# Patient Record
Sex: Female | Born: 1990 | Race: White | Hispanic: No | Marital: Single | Smoking: Never smoker
Health system: Southern US, Community
[De-identification: ages and names within clinical notes are randomized; demographics above are authoritative.]

## PROBLEM LIST (undated history)

## (undated) ENCOUNTER — Inpatient Hospital Stay (HOSPITAL_COMMUNITY): Payer: Self-pay

## (undated) DIAGNOSIS — E079 Disorder of thyroid, unspecified: Secondary | ICD-10-CM

## (undated) DIAGNOSIS — N946 Dysmenorrhea, unspecified: Secondary | ICD-10-CM

## (undated) DIAGNOSIS — F419 Anxiety disorder, unspecified: Secondary | ICD-10-CM

## (undated) DIAGNOSIS — F988 Other specified behavioral and emotional disorders with onset usually occurring in childhood and adolescence: Secondary | ICD-10-CM

## (undated) HISTORY — DX: Other specified behavioral and emotional disorders with onset usually occurring in childhood and adolescence: F98.8

## (undated) HISTORY — DX: Disorder of thyroid, unspecified: E07.9

## (undated) HISTORY — PX: OTHER SURGICAL HISTORY: SHX169

## (undated) HISTORY — DX: Anxiety disorder, unspecified: F41.9

## (undated) HISTORY — DX: Dysmenorrhea, unspecified: N94.6

---

## 2010-06-24 LAB — HM PAP SMEAR: HM Pap smear: NORMAL

## 2010-08-12 ENCOUNTER — Emergency Department (HOSPITAL_BASED_OUTPATIENT_CLINIC_OR_DEPARTMENT_OTHER): Admission: EM | Admit: 2010-08-12 | Discharge: 2010-08-12 | Payer: Self-pay | Admitting: Emergency Medicine

## 2010-08-12 ENCOUNTER — Ambulatory Visit: Payer: Self-pay | Admitting: Diagnostic Radiology

## 2011-01-14 ENCOUNTER — Encounter: Payer: Self-pay | Admitting: *Deleted

## 2011-01-14 DIAGNOSIS — F32A Depression, unspecified: Secondary | ICD-10-CM | POA: Insufficient documentation

## 2011-01-14 DIAGNOSIS — F329 Major depressive disorder, single episode, unspecified: Secondary | ICD-10-CM

## 2013-06-20 ENCOUNTER — Telehealth: Payer: Self-pay | Admitting: Nurse Practitioner

## 2013-06-26 ENCOUNTER — Telehealth: Payer: Self-pay | Admitting: Nurse Practitioner

## 2013-06-26 NOTE — Telephone Encounter (Signed)
LM that she must be seen

## 2013-07-27 ENCOUNTER — Ambulatory Visit (INDEPENDENT_AMBULATORY_CARE_PROVIDER_SITE_OTHER): Payer: BC Managed Care – PPO | Admitting: Nurse Practitioner

## 2013-07-27 ENCOUNTER — Encounter: Payer: Self-pay | Admitting: Nurse Practitioner

## 2013-07-27 VITALS — BP 109/67 | HR 66 | Temp 99.0°F | Ht 62.0 in | Wt 177.0 lb

## 2013-07-27 DIAGNOSIS — F988 Other specified behavioral and emotional disorders with onset usually occurring in childhood and adolescence: Secondary | ICD-10-CM | POA: Insufficient documentation

## 2013-07-27 MED ORDER — LISDEXAMFETAMINE DIMESYLATE 30 MG PO CAPS: 30.0000 mg | ORAL_CAPSULE | ORAL | Status: DC

## 2013-07-27 NOTE — Patient Instructions (Signed)
Attention Deficit Hyperactivity Disorder Attention deficit hyperactivity disorder (ADHD) is a problem with behavior issues based on the way the brain functions (neurobehavioral disorder). It is a common reason for behavior and academic problems in school. CAUSES  The cause of ADHD is unknown in most cases. It may run in families. It sometimes can be associated with learning disabilities and other behavioral problems. SYMPTOMS  There are 3 types of ADHD. The 3 types and some of the symptoms include:  Inattentive  Gets bored or distracted easily.  Loses or forgets things. Forgets to hand in homework.  Has trouble organizing or completing tasks.  Difficulty staying on task.  An inability to organize daily tasks and school work.  Leaving projects, chores, or homework unfinished.  Trouble paying attention or responding to details. Careless mistakes.  Difficulty following directions. Often seems like is not listening.  Dislikes activities that require sustained attention (like chores or homework).  Hyperactive-impulsive  Feels like it is impossible to sit still or stay in a seat. Fidgeting with hands and feet.  Trouble waiting turn.  Talking too much or out of turn. Interruptive.  Speaks or acts impulsively.  Aggressive, disruptive behavior.  Constantly busy or on the go, noisy.  Combined  Has symptoms of both of the above. Often children with ADHD feel discouraged about themselves and with school. They often perform well below their abilities in school. These symptoms can cause problems in home, school, and in relationships with peers. As children get older, the excess motor activities can calm down, but the problems with paying attention and staying organized persist. Most children do not outgrow ADHD but with good treatment can learn to cope with the symptoms. DIAGNOSIS  When ADHD is suspected, the diagnosis should be made by professionals trained in ADHD.  Diagnosis will  include:  Ruling out other reasons for the child's behavior.  The caregivers will check with the child's school and check their medical records.  They will talk to teachers and parents.  Behavior rating scales for the child will be filled out by those dealing with the child on a daily basis. A diagnosis is made only after all information has been considered. TREATMENT  Treatment usually includes behavioral treatment often along with medicines. It may include stimulant medicines. The stimulant medicines decrease impulsivity and hyperactivity and increase attention. Other medicines used include antidepressants and certain blood pressure medicines. Most experts agree that treatment for ADHD should address all aspects of the child's functioning. Treatment should not be limited to the use of medicines alone. Treatment should include structured classroom management. The parents must receive education to address rewarding good behavior, discipline, and limit-setting. Tutoring or behavioral therapy or both should be available for the child. If untreated, the disorder can have long-term serious effects into adolescence and adulthood. HOME CARE INSTRUCTIONS   Often with ADHD there is a lot of frustration among the family in dealing with the illness. There is often blame and anger that is not warranted. This is a life long illness. There is no way to prevent ADHD. In many cases, because the problem affects the family as a whole, the entire family may need help. A therapist can help the family find better ways to handle the disruptive behaviors and promote change. If the child is young, most of the therapist's work is with the parents. Parents will learn techniques for coping with and improving their child's behavior. Sometimes only the child with the ADHD needs counseling. Your caregivers can help   you make these decisions.  Children with ADHD may need help in organizing. Some helpful tips include:  Keep  routines the same every day from wake-up time to bedtime. Schedule everything. This includes homework and playtime. This should include outdoor and indoor recreation. Keep the schedule on the refrigerator or a bulletin board where it is frequently seen. Mark schedule changes as far in advance as possible.  Have a place for everything and keep everything in its place. This includes clothing, backpacks, and school supplies.  Encourage writing down assignments and bringing home needed books.  Offer your child a well-balanced diet. Breakfast is especially important for school performance. Children should avoid drinks with caffeine including:  Soft drinks.  Coffee.  Tea.  However, some older children (adolescents) may find these drinks helpful in improving their attention.  Children with ADHD need consistent rules that they can understand and follow. If rules are followed, give small rewards. Children with ADHD often receive, and expect, criticism. Look for good behavior and praise it. Set realistic goals. Give clear instructions. Look for activities that can foster success and self-esteem. Make time for pleasant activities with your child. Give lots of affection.  Parents are their children's greatest advocates. Learn as much as possible about ADHD. This helps you become a stronger and better advocate for your child. It also helps you educate your child's teachers and instructors if they feel inadequate in these areas. Parent support groups are often helpful. A national group with local chapters is called CHADD (Children and Adults with Attention Deficit Hyperactivity Disorder). PROGNOSIS  There is no cure for ADHD. Children with the disorder seldom outgrow it. Many find adaptive ways to accommodate the ADHD as they mature. SEEK MEDICAL CARE IF:  Your child has repeated muscle twitches, cough or speech outbursts.  Your child has sleep problems.  Your child has a marked loss of  appetite.  Your child develops depression.  Your child has new or worsening behavioral problems.  Your child develops dizziness.  Your child has a racing heart.  Your child has stomach pains.  Your child develops headaches. Document Released: 10/01/2002 Document Revised: 01/03/2012 Document Reviewed: 05/13/2008 ExitCare Patient Information 2014 [employer].  

## 2013-07-27 NOTE — Progress Notes (Signed)
  Subjective:    Patient ID: Shelley Whitehead, female    DOB: 11/09/1990, 22 y.o.   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    Review of Systems  All other systems reviewed and are negative.       Objective:   Physical Exam  Constitutional: She appears well-developed and well-nourished.  Cardiovascular: Normal rate, regular rhythm and normal heart sounds.   Pulmonary/Chest: Effort normal and breath sounds normal.  Psychiatric: She has a normal mood and affect. Her behavior is normal. Judgment and thought content normal.   BP 109/67  Pulse 66  Temp(Src) 99 F (37.2 C) (Oral)  Ht 5\' 2"  (1.575 m)  Wt 177 lb (80.287 kg)  BMI 32.37 kg/m2  LMP 07/21/2013        Assessment & Plan:   1. ADD (attention deficit disorder)    Meds ordered this encounter  Medications  . DISCONTD: lisdexamfetamine (VYVANSE) 20 MG capsule    Sig: Take 20 mg by mouth every morning.  . lisdexamfetamine (VYVANSE) 30 MG capsule    Sig: Take 1 capsule (30 mg total) by mouth every morning.    Dispense:  30 capsule    Refill:  0    Order Specific Question:  Supervising Provider    Answer:  Ernestina Penna [[id number]]  . lisdexamfetamine (VYVANSE) 30 MG capsule    Sig: Take 1 capsule (30 mg total) by mouth every morning.    Dispense:  30 capsule    Refill:  0    Do NOT FILL TILL 08/26/13    Order Specific Question:  Supervising Provider    Answer:  Ernestina Penna [[id number]]  Stress management Follow up in 2 months  Mary-Margaret Daphine Deutscher, FNP

## 2013-07-30 ENCOUNTER — Telehealth: Payer: Self-pay | Admitting: Nurse Practitioner

## 2013-11-07 ENCOUNTER — Telehealth: Payer: Self-pay | Admitting: Nurse Practitioner

## 2013-11-07 MED ORDER — LISDEXAMFETAMINE DIMESYLATE 30 MG PO CAPS: 30.0000 mg | ORAL_CAPSULE | ORAL | Status: DC

## 2013-11-07 NOTE — Telephone Encounter (Signed)
rx ready for pickup 

## 2013-11-07 NOTE — Telephone Encounter (Signed)
Pt notified to pick up rx.

## 2014-01-14 ENCOUNTER — Telehealth: Payer: Self-pay | Admitting: Nurse Practitioner

## 2014-01-15 ENCOUNTER — Telehealth: Payer: Self-pay | Admitting: Nurse Practitioner

## 2014-01-15 NOTE — Telephone Encounter (Signed)
Left details, must pick up vyvance scripts.

## 2014-01-15 NOTE — Telephone Encounter (Signed)
This prescription has to be picked up. It can't be called in to the pharmacy.

## 2014-01-15 NOTE — Telephone Encounter (Signed)
Shouldn't need referral rto see family doctor

## 2014-01-16 NOTE — Telephone Encounter (Signed)
Pt aware.

## 2014-03-01 ENCOUNTER — Telehealth: Payer: Self-pay | Admitting: Nurse Practitioner

## 2014-03-01 MED ORDER — LISDEXAMFETAMINE DIMESYLATE 30 MG PO CAPS: 30.0000 mg | ORAL_CAPSULE | ORAL | Status: DC

## 2014-03-01 NOTE — Telephone Encounter (Signed)
Patient rx up front

## 2014-03-01 NOTE — Telephone Encounter (Signed)
NTBS for future refills 

## 2014-05-22 ENCOUNTER — Telehealth: Payer: Self-pay | Admitting: Family Medicine

## 2014-05-22 MED ORDER — LISDEXAMFETAMINE DIMESYLATE 30 MG PO CAPS: 30.0000 mg | ORAL_CAPSULE | ORAL | Status: DC

## 2014-05-22 NOTE — Telephone Encounter (Signed)
PATIENT AWARE

## 2014-05-22 NOTE — Telephone Encounter (Signed)
Not seen since 07/2013

## 2014-05-22 NOTE — Telephone Encounter (Signed)
rx ready for pickup 

## 2014-07-17 ENCOUNTER — Telehealth: Payer: Self-pay | Admitting: Nurse Practitioner

## 2014-07-17 MED ORDER — LISDEXAMFETAMINE DIMESYLATE 30 MG PO CAPS: 30.0000 mg | ORAL_CAPSULE | ORAL | Status: DC

## 2014-07-17 NOTE — Telephone Encounter (Signed)
rx ready for pickup 

## 2014-07-18 NOTE — Telephone Encounter (Signed)
Patient aware to pick up on vm

## 2014-09-02 ENCOUNTER — Telehealth: Payer: Self-pay | Admitting: Nurse Practitioner

## 2014-09-02 MED ORDER — LISDEXAMFETAMINE DIMESYLATE 30 MG PO CAPS: 30.0000 mg | ORAL_CAPSULE | ORAL | Status: DC

## 2014-09-02 NOTE — Telephone Encounter (Signed)
rx ready for pickup 

## 2014-09-03 NOTE — Telephone Encounter (Signed)
Patient aware to pick up left message

## 2014-10-28 ENCOUNTER — Telehealth: Payer: Self-pay | Admitting: *Deleted

## 2014-10-28 ENCOUNTER — Telehealth: Payer: Self-pay | Admitting: Nurse Practitioner

## 2014-10-28 MED ORDER — LISDEXAMFETAMINE DIMESYLATE 30 MG PO CAPS: 30.0000 mg | ORAL_CAPSULE | ORAL | Status: DC

## 2014-10-28 NOTE — Telephone Encounter (Signed)
vyvance script ready.

## 2014-10-28 NOTE — Telephone Encounter (Signed)
vyvanse rx ready for pick up  

## 2014-10-31 ENCOUNTER — Encounter: Payer: Self-pay | Admitting: Family Medicine

## 2014-10-31 ENCOUNTER — Other Ambulatory Visit: Payer: Self-pay | Admitting: Nurse Practitioner

## 2014-10-31 ENCOUNTER — Ambulatory Visit (INDEPENDENT_AMBULATORY_CARE_PROVIDER_SITE_OTHER): Payer: BC Managed Care – PPO | Admitting: Family Medicine

## 2014-10-31 VITALS — BP 120/76 | HR 73 | Temp 99.0°F | Ht 62.0 in | Wt 177.2 lb

## 2014-10-31 DIAGNOSIS — J069 Acute upper respiratory infection, unspecified: Secondary | ICD-10-CM

## 2014-10-31 MED ORDER — AMOXICILLIN 875 MG PO TABS: 875.0000 mg | ORAL_TABLET | Freq: Two times a day (BID) | ORAL | Status: DC

## 2014-10-31 NOTE — Progress Notes (Signed)
   Subjective:    Patient ID: Shelley Whitehead, female    DOB: 1991/05/30, 24 y.o.   MRN: [id number]  HPI Patient is here for c/o uri sx's for over a week.  Review of Systems  Constitutional: Negative for fever.  HENT: Negative for ear pain.   Eyes: Negative for discharge.  Respiratory: Negative for cough.   Cardiovascular: Negative for chest pain.  Gastrointestinal: Negative for abdominal distention.  Endocrine: Negative for polyuria.  Genitourinary: Negative for difficulty urinating.  Musculoskeletal: Negative for gait problem and neck pain.  Skin: Negative for color change and rash.  Neurological: Negative for speech difficulty and headaches.  Psychiatric/Behavioral: Negative for agitation.       Objective:    BP 120/76 mmHg  Pulse 73  Temp(Src) 99 F (37.2 C) (Oral)  Ht 5\' 2"  (1.575 m)  Wt 177 lb 3.2 oz (80.377 kg)  BMI 32.40 kg/m2  LMP 08/31/2014 Physical Exam  Constitutional: She is oriented to person, place, and time. She appears well-developed and well-nourished.  HENT:  Head: Normocephalic and atraumatic.  Mouth/Throat: Oropharynx is clear and moist.  Eyes: Pupils are equal, round, and reactive to light.  Neck: Normal range of motion. Neck supple.  Cardiovascular: Normal rate and regular rhythm.   No murmur heard. Pulmonary/Chest: Effort normal and breath sounds normal.  Abdominal: Soft. Bowel sounds are normal. There is no tenderness.  Neurological: She is alert and oriented to person, place, and time.  Skin: Skin is warm and dry.  Psychiatric: She has a normal mood and affect.          Assessment & Plan:     ICD-9-CM ICD-10-CM   1. URI (upper respiratory infection) 465.9 J06.9 amoxicillin (AMOXIL) 875 MG tablet   Push po fluids, rest, tylenol and motrin otc prn as directed for fever, arthralgias, and myalgias.  Follow up prn if sx's continue or persist.  No Follow-up on file.  Deatra CanterWilliam J Alaisha Eversley FNP

## 2014-12-06 ENCOUNTER — Telehealth: Payer: Self-pay | Admitting: Nurse Practitioner

## 2014-12-06 MED ORDER — LISDEXAMFETAMINE DIMESYLATE 30 MG PO CAPS: 30.0000 mg | ORAL_CAPSULE | ORAL | Status: DC

## 2014-12-06 NOTE — Telephone Encounter (Signed)
Patient aware rx ready to be picked up 

## 2014-12-06 NOTE — Telephone Encounter (Signed)
vyanse rx ready for pick up  

## 2014-12-10 ENCOUNTER — Telehealth: Payer: Self-pay | Admitting: *Deleted

## 2014-12-10 NOTE — Telephone Encounter (Signed)
Patient notified that rx up front and ready to pick up. 

## 2015-01-24 ENCOUNTER — Telehealth: Payer: Self-pay | Admitting: Nurse Practitioner

## 2015-01-24 MED ORDER — LISDEXAMFETAMINE DIMESYLATE 30 MG PO CAPS: 30.0000 mg | ORAL_CAPSULE | ORAL | Status: DC

## 2015-01-24 NOTE — Telephone Encounter (Signed)
vyvnase rx ready for pick up- no more refills without being seen  

## 2015-02-21 ENCOUNTER — Telehealth: Payer: Self-pay | Admitting: Nurse Practitioner

## 2015-02-21 MED ORDER — LISDEXAMFETAMINE DIMESYLATE 30 MG PO CAPS: 30.0000 mg | ORAL_CAPSULE | ORAL | Status: DC

## 2015-02-21 NOTE — Telephone Encounter (Signed)
RX ready for pick up 

## 2015-02-24 NOTE — Telephone Encounter (Signed)
Lm,script for vyvance ready. 

## 2015-04-23 ENCOUNTER — Telehealth: Payer: Self-pay | Admitting: Nurse Practitioner

## 2015-04-23 NOTE — Telephone Encounter (Signed)
Please review and advise.

## 2015-04-23 NOTE — Telephone Encounter (Signed)
Refill denied was told in april  no more refills without being seen

## 2015-04-23 NOTE — Telephone Encounter (Signed)
Pt.notified

## 2015-04-23 NOTE — Telephone Encounter (Signed)
Again refill deneied was told back in April that no more refills without being seen- was given another one last month but no more refills without being seen

## 2015-04-25 ENCOUNTER — Ambulatory Visit (INDEPENDENT_AMBULATORY_CARE_PROVIDER_SITE_OTHER): Payer: BC Managed Care – PPO | Admitting: Nurse Practitioner

## 2015-04-25 ENCOUNTER — Encounter: Payer: Self-pay | Admitting: Nurse Practitioner

## 2015-04-25 VITALS — BP 113/73 | HR 75 | Temp 98.6°F | Ht 62.0 in | Wt 191.0 lb

## 2015-04-25 DIAGNOSIS — F909 Attention-deficit hyperactivity disorder, unspecified type: Secondary | ICD-10-CM | POA: Diagnosis not present

## 2015-04-25 DIAGNOSIS — F988 Other specified behavioral and emotional disorders with onset usually occurring in childhood and adolescence: Secondary | ICD-10-CM

## 2015-04-25 MED ORDER — LISDEXAMFETAMINE DIMESYLATE 40 MG PO CAPS: 40.0000 mg | ORAL_CAPSULE | ORAL | Status: DC

## 2015-04-25 NOTE — Progress Notes (Signed)
   Subjective:    Patient ID: Shelley Whitehead, female    DOB: Jan 17, 1991, 24 y.o.   MRN: [id number]  HPI Patient brought in today by self for follow up of ADD. Currently taking vyvanse 30mg  daily. 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 Would like to try a higher dose.      Review of Systems  Constitutional: Negative.   HENT: Negative.   Respiratory: Negative.   Cardiovascular: Negative.   Gastrointestinal: Negative.   Genitourinary: Negative.   Neurological: Negative.   Psychiatric/Behavioral: Negative.   All other systems reviewed and are negative.      Objective:   Physical Exam  Constitutional: She is oriented to person, place, and time. She appears well-developed and well-nourished.  Cardiovascular: Normal rate, regular rhythm and normal heart sounds.   Pulmonary/Chest: Effort normal and breath sounds normal.  Neurological: She is alert and oriented to person, place, and time.  Skin: Skin is warm and dry.  Psychiatric: She has a normal mood and affect. Her behavior is normal. Judgment and thought content normal.   BP 113/73 mmHg  Pulse 75  Temp(Src) 98.6 F (37 C) (Oral)  Ht 5\' 2"  (1.575 m)  Wt 191 lb (86.637 kg)  BMI 34.93 kg/m2        Assessment & Plan:   1. ADD (attention deficit disorder)    Meds ordered this encounter  Medications  . levonorgestrel-ethinyl estradiol (AVIANE,ALESSE,LESSINA) 0.1-20 MG-MCG tablet    Sig: Take 1 tablet by mouth daily.  Marland Kitchen. lisdexamfetamine (VYVANSE) 40 MG capsule    Sig: Take 1 capsule (40 mg total) by mouth every morning.    Dispense:  30 capsule    Refill:  0    Order Specific Question:  Supervising Provider    Answer:  Ernestina PennaMOORE, DONALD W [[id number]]  . lisdexamfetamine (VYVANSE) 40 MG capsule    Sig: Take 1 capsule (40 mg total) by mouth every  morning.    Dispense:  30 capsule    Refill:  0    Do not fill till 05/25/15    Order Specific Question:  Supervising Provider    Answer:  Ernestina PennaMOORE, DONALD W [[id number]]  . lisdexamfetamine (VYVANSE) 40 MG capsule    Sig: Take 1 capsule (40 mg total) by mouth every morning.    Dispense:  30 capsule    Refill:  0    Do not fill till 06/24/15    Order Specific Question:  Supervising Provider    Answer:  Ernestina PennaMOORE, DONALD W [[id number]]   Stress management Follow up in 3 months  Mary-Margaret Daphine DeutscherMartin, FNP

## 2015-04-25 NOTE — Patient Instructions (Signed)
Stress and Stress Management Stress is a normal reaction to life events. It is what you feel when life demands more than you are used to or more than you can handle. Some stress can be useful. For example, the stress reaction can help you catch the last bus of the day, study for a test, or meet a deadline at work. But stress that occurs too often or for too long can cause problems. It can affect your emotional health and interfere with relationships and normal daily activities. Too much stress can weaken your immune system and increase your risk for physical illness. If you already have a medical problem, stress can make it worse. CAUSES  All sorts of life events may cause stress. An event that causes stress for one person may not be stressful for another person. Major life events commonly cause stress. These may be positive or negative. Examples include losing your job, moving into a new home, getting married, having a baby, or losing a loved one. Less obvious life events may also cause stress, especially if they occur day after day or in combination. Examples include working long hours, driving in traffic, caring for children, being in debt, or being in a difficult relationship. SIGNS AND SYMPTOMS Stress may cause emotional symptoms including, the following:  Anxiety. This is feeling worried, afraid, on edge, overwhelmed, or out of control.  Anger. This is feeling irritated or impatient.  Depression. This is feeling sad, down, helpless, or guilty.  Difficulty focusing, remembering, or making decisions. Stress may cause physical symptoms, including the following:   Aches and pains. These may affect your head, neck, back, stomach, or other areas of your body.  Tight muscles or clenched jaw.  Low energy or trouble sleeping. Stress may cause unhealthy behaviors, including the following:   Eating to feel better (overeating) or skipping meals.  Sleeping too little, too much, or both.  Working  too much or putting off tasks (procrastination).  Smoking, drinking alcohol, or using drugs to feel better. DIAGNOSIS  Stress is diagnosed through an assessment by your health care provider. Your health care provider will ask questions about your symptoms and any stressful life events.Your health care provider will also ask about your medical history and may order blood tests or other tests. Certain medical conditions and medicine can cause physical symptoms similar to stress. Mental illness can cause emotional symptoms and unhealthy behaviors similar to stress. Your health care provider may refer you to a mental health professional for further evaluation.  TREATMENT  Stress management is the recommended treatment for stress.The goals of stress management are reducing stressful life events and coping with stress in healthy ways.  Techniques for reducing stressful life events include the following:  Stress identification. Self-monitor for stress and identify what causes stress for you. These skills may help you to avoid some stressful events.  Time management. Set your priorities, keep a calendar of events, and learn to say "no." These tools can help you avoid making too many commitments. Techniques for coping with stress include the following:  Rethinking the problem. Try to think realistically about stressful events rather than ignoring them or overreacting. Try to find the positives in a stressful situation rather than focusing on the negatives.  Exercise. Physical exercise can release both physical and emotional tension. The key is to find a form of exercise you enjoy and do it regularly.  Relaxation techniques. These relax the body and mind. Examples include yoga, meditation, tai chi, biofeedback, deep  breathing, progressive muscle relaxation, listening to music, being out in nature, journaling, and other hobbies. Again, the key is to find one or more that you enjoy and can do  regularly.  Healthy lifestyle. Eat a balanced diet, get plenty of sleep, and do not smoke. Avoid using alcohol or drugs to relax.  Strong support network. Spend time with family, friends, or other people you enjoy being around.Express your feelings and talk things over with someone you trust. Counseling or talktherapy with a mental health professional may be helpful if you are having difficulty managing stress on your own. Medicine is typically not recommended for the treatment of stress.Talk to your health care provider if you think you need medicine for symptoms of stress. HOME CARE INSTRUCTIONS  Keep all follow-up visits as directed by your health care provider.  Take all medicines as directed by your health care provider. SEEK MEDICAL CARE IF:  Your symptoms get worse or you start having new symptoms.  You feel overwhelmed by your problems and can no longer manage them on your own. SEEK IMMEDIATE MEDICAL CARE IF:  You feel like hurting yourself or someone else. Document Released: 04/06/2001 Document Revised: 02/25/2014 Document Reviewed: 06/05/2013 ExitCare Patient Information 2015 [employer]. This information is not intended to replace advice given to you by your health care provider. Make sure you discuss any questions you have with your health care provider.  

## 2015-07-29 ENCOUNTER — Other Ambulatory Visit: Payer: Self-pay | Admitting: Advanced Practice Midwife

## 2015-08-21 ENCOUNTER — Telehealth: Payer: Self-pay | Admitting: Nurse Practitioner

## 2015-08-21 DIAGNOSIS — F988 Other specified behavioral and emotional disorders with onset usually occurring in childhood and adolescence: Secondary | ICD-10-CM

## 2015-08-21 MED ORDER — LISDEXAMFETAMINE DIMESYLATE 40 MG PO CAPS
40.0000 mg | ORAL_CAPSULE | ORAL | Status: DC
Start: 2015-08-21 — End: 2015-10-06

## 2015-08-21 NOTE — Telephone Encounter (Signed)
vyvanse rx ready for pick up  

## 2015-10-04 ENCOUNTER — Other Ambulatory Visit: Payer: Self-pay | Admitting: Nurse Practitioner

## 2015-10-04 DIAGNOSIS — F988 Other specified behavioral and emotional disorders with onset usually occurring in childhood and adolescence: Secondary | ICD-10-CM

## 2015-10-06 MED ORDER — LISDEXAMFETAMINE DIMESYLATE 40 MG PO CAPS: 40.0000 mg | ORAL_CAPSULE | ORAL | Status: DC

## 2015-10-06 NOTE — Telephone Encounter (Signed)
Pt notified that rx is ready to be picked up and up front and to bring ID

## 2015-10-06 NOTE — Telephone Encounter (Signed)
Last filled 08/21/15, last seen 04/25/15

## 2015-10-06 NOTE — Telephone Encounter (Signed)
vyvanse rx ready for pick up- Last refill without being seen   

## 2015-10-14 ENCOUNTER — Telehealth: Payer: Self-pay

## 2015-10-14 NOTE — Telephone Encounter (Signed)
Insurance prior authorized Vyvanse through 10/08/16

## 2015-11-17 ENCOUNTER — Other Ambulatory Visit: Payer: Self-pay | Admitting: Nurse Practitioner

## 2015-11-17 DIAGNOSIS — F988 Other specified behavioral and emotional disorders with onset usually occurring in childhood and adolescence: Secondary | ICD-10-CM

## 2015-11-17 NOTE — Telephone Encounter (Signed)
Refill deneid has not been seen in 6 months

## 2015-11-17 NOTE — Telephone Encounter (Signed)
Last filled 10/06/15, last seen 04/25/15

## 2015-11-19 ENCOUNTER — Telehealth: Payer: Self-pay | Admitting: Nurse Practitioner

## 2015-11-19 NOTE — Telephone Encounter (Signed)
Pt given appt with MMM tomorrow at 3:45.

## 2015-11-20 ENCOUNTER — Ambulatory Visit (INDEPENDENT_AMBULATORY_CARE_PROVIDER_SITE_OTHER): Payer: BC Managed Care – PPO | Admitting: Nurse Practitioner

## 2015-11-20 ENCOUNTER — Encounter: Payer: Self-pay | Admitting: Nurse Practitioner

## 2015-11-20 VITALS — BP 122/84 | HR 70 | Temp 97.8°F | Ht 62.0 in | Wt 193.0 lb

## 2015-11-20 DIAGNOSIS — F909 Attention-deficit hyperactivity disorder, unspecified type: Secondary | ICD-10-CM

## 2015-11-20 DIAGNOSIS — F988 Other specified behavioral and emotional disorders with onset usually occurring in childhood and adolescence: Secondary | ICD-10-CM

## 2015-11-20 MED ORDER — LISDEXAMFETAMINE DIMESYLATE 40 MG PO CAPS: 40.0000 mg | ORAL_CAPSULE | ORAL | Status: DC

## 2015-11-20 NOTE — Progress Notes (Signed)
   Subjective:    Patient ID: Shelley Whitehead, female    DOB: 1991-07-18, 25 y.o.   MRN: [id number]  HPI Patient in today for ADD follow up. 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 SHe has no side effects from medication.    Review of Systems  Constitutional: Negative.   HENT: Negative.   Respiratory: Negative.   Cardiovascular: Negative.   Genitourinary: Negative.   Neurological: Negative.   Psychiatric/Behavioral: Negative.   All other systems reviewed and are negative.      Objective:   Physical Exam  Constitutional: She is oriented to person, place, and time. She appears well-developed and well-nourished.  Cardiovascular: Normal rate and normal heart sounds.   Pulmonary/Chest: Effort normal and breath sounds normal.  Neurological: She is alert and oriented to person, place, and time.  Skin: Skin is warm.  Psychiatric: She has a normal mood and affect. Her behavior is normal. Judgment and thought content normal.    BP 122/84 mmHg  Pulse 70  Temp(Src) 97.8 F (36.6 C) (Oral)  Ht  (1.575 m)  Wt 193 lb (87.544 kg)  BMI 35.29 kg/m2       Assessment & Plan:  1. ADD (attention deficit disorder) Stress management Discussed new substance control policy - lisdexamfetamine (VYVANSE) 40 MG capsule; Take 1 capsule (40 mg total) by mouth every morning.  Dispense: 30 capsule; Refill: 0 - lisdexamfetamine (VYVANSE) 40 MG capsule; Take 1 capsule (40 mg total) by mouth every morning.  Dispense: 30 capsule; Refill: 0 - lisdexamfetamine (VYVANSE) 40 MG capsule; Take 1 capsule (40 mg total) by mouth every morning.  Dispense: 30 capsule; Refill: 0  Mary-Margaret Daphine Deutscher, FNP

## 2015-12-17 ENCOUNTER — Ambulatory Visit (INDEPENDENT_AMBULATORY_CARE_PROVIDER_SITE_OTHER): Payer: BC Managed Care – PPO | Admitting: Pediatrics

## 2015-12-17 ENCOUNTER — Encounter: Payer: Self-pay | Admitting: Pediatrics

## 2015-12-17 VITALS — BP 127/85 | HR 85 | Temp 97.6°F | Ht 62.0 in | Wt 188.6 lb

## 2015-12-17 DIAGNOSIS — J069 Acute upper respiratory infection, unspecified: Secondary | ICD-10-CM

## 2015-12-17 DIAGNOSIS — R6889 Other general symptoms and signs: Secondary | ICD-10-CM

## 2015-12-17 DIAGNOSIS — H6503 Acute serous otitis media, bilateral: Secondary | ICD-10-CM | POA: Diagnosis not present

## 2015-12-17 LAB — POCT INFLUENZA A/B
INFLUENZA A, POC: NEGATIVE
Influenza B, POC: NEGATIVE

## 2015-12-17 MED ORDER — AMOXICILLIN 500 MG PO CAPS: 500.0000 mg | ORAL_CAPSULE | Freq: Two times a day (BID) | ORAL | Status: DC

## 2015-12-17 NOTE — Progress Notes (Signed)
    Subjective:    Patient ID: Shelley Whitehead, female    DOB: October 29, 1990, 25 y.o.   MRN: [id number]  CC: Fever; Generalized Body Aches; and Nasal Congestion   HPI: Shelley Whitehead is a 25 y.o. 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 Interim medical history since our last visit reviewed. Allergies and medications reviewed and updated.    ROS: Per HPI unless specifically indicated above  History  Smoking status  . Never Smoker   Smokeless tobacco  . Not on file        Objective:    BP 127/85 mmHg  Pulse 85  Temp(Src) 97.6 F (36.4 C) (Oral)  Ht  (1.575 m)  Wt 188 lb 9.6 oz (85.548 kg)  BMI 34.49 kg/m2  Wt Readings from Last 3 Encounters:  12/17/15 188 lb 9.6 oz (85.548 kg)  11/20/15 193 lb (87.544 kg)  04/25/15 191 lb (86.637 kg)     Gen: NAD, alert, cooperative with exam, NCAT, congested EYES: EOMI, no scleral injection or icterus ENT:  TMs red and bulging b/l, OP with erythema, lesions LYMPH: +<1cm ant cervical LAD CV: NRRR, normal S1/S2, no murmur, distal pulses 2+ b/l Resp: CTABL, no wheezes, normal WOB Abd: +BS, soft, NTND.  Ext: No edema, warm Neuro: Alert and oriented, strength equal b/l UE and LE, coordination grossly normal MSK: normal muscle bulk     Assessment & Plan:    Shelley Whitehead was seen today for fever, generalized body aches and nasal congestion. Flu test negative. Discussed symptomatic care, may still have flu given symptoms and imperfect sensitivity of test, pt not within  window of treatment. Does have b/l AOM, pain in ears, fevers, will treat as below, also would cover for strep so will not test for strep at this time.  Diagnoses and all orders for this visit:  Flu-like symptoms -     POCT Influenza A/B  Bilateral acute serous otitis media, recurrence not specified -     amoxicillin (AMOXIL) 500 MG capsule; Take 1 capsule (500 mg total) by mouth 2 (two) times daily.  Follow up plan: Return if symptoms worsen or fail to improve.  Rex Kras, MD [facility] 12/17/2015, 5:51 PM

## 2015-12-23 ENCOUNTER — Telehealth: Payer: Self-pay | Admitting: Nurse Practitioner

## 2015-12-23 NOTE — Telephone Encounter (Signed)
She was seen last week for ear pain and it has gotten a lot worse, Sore throat, and neck pain

## 2015-12-23 NOTE — Telephone Encounter (Signed)
Advised pt of provider feedback. Pt voiced understanding.

## 2015-12-23 NOTE — Telephone Encounter (Signed)
Just viral- treat symptoms with motrin or tylenol- just has to run it course.

## 2016-02-03 ENCOUNTER — Encounter: Payer: Self-pay | Admitting: *Deleted

## 2016-02-03 ENCOUNTER — Ambulatory Visit (INDEPENDENT_AMBULATORY_CARE_PROVIDER_SITE_OTHER): Payer: BC Managed Care – PPO | Admitting: Nurse Practitioner

## 2016-02-03 ENCOUNTER — Encounter (INDEPENDENT_AMBULATORY_CARE_PROVIDER_SITE_OTHER): Payer: Self-pay

## 2016-02-03 ENCOUNTER — Encounter: Payer: Self-pay | Admitting: Nurse Practitioner

## 2016-02-03 VITALS — BP 117/72 | HR 70 | Temp 97.2°F | Ht 62.0 in | Wt 187.0 lb

## 2016-02-03 DIAGNOSIS — F909 Attention-deficit hyperactivity disorder, unspecified type: Secondary | ICD-10-CM | POA: Diagnosis not present

## 2016-02-03 DIAGNOSIS — F988 Other specified behavioral and emotional disorders with onset usually occurring in childhood and adolescence: Secondary | ICD-10-CM

## 2016-02-03 MED ORDER — LISDEXAMFETAMINE DIMESYLATE 40 MG PO CAPS: 40.0000 mg | ORAL_CAPSULE | ORAL | Status: DC

## 2016-02-03 NOTE — Progress Notes (Signed)
   Subjective:    Patient ID: Arman FilterMegan Spatafore, female    DOB: [birth year], 25 y.o.   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 She has had a long history of ADD. She has no medication side effects.    Review of Systems  Constitutional: Negative.   HENT: Negative.   Respiratory: Negative.   Cardiovascular: Negative.   Genitourinary: Negative.   Neurological: Negative.   Psychiatric/Behavioral: Negative.   All other systems reviewed and are negative.      Objective:   Physical Exam  Constitutional: She is oriented to person, place, and time. She appears well-developed and well-nourished.  Cardiovascular: Normal rate, regular rhythm and normal heart sounds.   Pulmonary/Chest: Effort normal and breath sounds normal.  Neurological: She is alert and oriented to person, place, and time.  Skin: Skin is warm.  Psychiatric: She has a normal mood and affect. Her behavior is normal. Judgment and thought content normal.   BP 117/72 mmHg  Pulse 70  Temp(Src) 97.2 F (36.2 C) (Oral)  Ht 5\' 2"  (1.575 m)  Wt 187 lb (84.823 kg)  BMI 34.19 kg/m2        Assessment & Plan:  1. ADD (attention deficit disorder) Stress mangement - lisdexamfetamine (VYVANSE) 40 MG capsule; Take 1 capsule (40 mg total) by mouth every morning.  Dispense: 30 capsule; Refill: 0 - lisdexamfetamine (VYVANSE) 40 MG capsule; Take 1 capsule (40 mg total) by mouth every morning.  Dispense: 30 capsule; Refill: 0 - lisdexamfetamine (VYVANSE) 40 MG capsule; Take 1 capsule (40 mg total) by mouth every morning.  Dispense: 30 capsule; Refill: 0  Mary-Margaret Daphine DeutscherMartin, FNP

## 2016-02-03 NOTE — Patient Instructions (Signed)
Stress and Stress Management Stress is a normal reaction to life events. It is what you feel when life demands more than you are used to or more than you can handle. Some stress can be useful. For example, the stress reaction can help you catch the last bus of the day, study for a test, or meet a deadline at work. But stress that occurs too often or for too long can cause problems. It can affect your emotional health and interfere with relationships and normal daily activities. Too much stress can weaken your immune system and increase your risk for physical illness. If you already have a medical problem, stress can make it worse. CAUSES  All sorts of life events may cause stress. An event that causes stress for one person may not be stressful for another person. Major life events commonly cause stress. These may be positive or negative. Examples include losing your job, moving into a new home, getting married, having a baby, or losing a loved one. Less obvious life events may also cause stress, especially if they occur day after day or in combination. Examples include working long hours, driving in traffic, caring for children, being in debt, or being in a difficult relationship. SIGNS AND SYMPTOMS Stress may cause emotional symptoms including, the following:  Anxiety. This is feeling worried, afraid, on edge, overwhelmed, or out of control.  Anger. This is feeling irritated or impatient.  Depression. This is feeling sad, down, helpless, or guilty.  Difficulty focusing, remembering, or making decisions. Stress may cause physical symptoms, including the following:   Aches and pains. These may affect your head, neck, back, stomach, or other areas of your body.  Tight muscles or clenched jaw.  Low energy or trouble sleeping. Stress may cause unhealthy behaviors, including the following:   Eating to feel better (overeating) or skipping meals.  Sleeping too little, too much, or both.  Working  too much or putting off tasks (procrastination).  Smoking, drinking alcohol, or using drugs to feel better. DIAGNOSIS  Stress is diagnosed through an assessment by your health care provider. Your health care provider will ask questions about your symptoms and any stressful life events.Your health care provider will also ask about your medical history and may order blood tests or other tests. Certain medical conditions and medicine can cause physical symptoms similar to stress. Mental illness can cause emotional symptoms and unhealthy behaviors similar to stress. Your health care provider may refer you to a mental health professional for further evaluation.  TREATMENT  Stress management is the recommended treatment for stress.The goals of stress management are reducing stressful life events and coping with stress in healthy ways.  Techniques for reducing stressful life events include the following:  Stress identification. Self-monitor for stress and identify what causes stress for you. These skills may help you to avoid some stressful events.  Time management. Set your priorities, keep a calendar of events, and learn to say "no." These tools can help you avoid making too many commitments. Techniques for coping with stress include the following:  Rethinking the problem. Try to think realistically about stressful events rather than ignoring them or overreacting. Try to find the positives in a stressful situation rather than focusing on the negatives.  Exercise. Physical exercise can release both physical and emotional tension. The key is to find a form of exercise you enjoy and do it regularly.  Relaxation techniques. These relax the body and mind. Examples include yoga, meditation, tai chi, biofeedback, deep  breathing, progressive muscle relaxation, listening to music, being out in nature, journaling, and other hobbies. Again, the key is to find one or more that you enjoy and can do  regularly.  Healthy lifestyle. Eat a balanced diet, get plenty of sleep, and do not smoke. Avoid using alcohol or drugs to relax.  Strong support network. Spend time with family, friends, or other people you enjoy being around.Express your feelings and talk things over with someone you trust. Counseling or talktherapy with a mental health professional may be helpful if you are having difficulty managing stress on your own. Medicine is typically not recommended for the treatment of stress.Talk to your health care provider if you think you need medicine for symptoms of stress. HOME CARE INSTRUCTIONS  Keep all follow-up visits as directed by your health care provider.  Take all medicines as directed by your health care provider. SEEK MEDICAL CARE IF:  Your symptoms get worse or you start having new symptoms.  You feel overwhelmed by your problems and can no longer manage them on your own. SEEK IMMEDIATE MEDICAL CARE IF:  You feel like hurting yourself or someone else.   This information is not intended to replace advice given to you by your health care provider. Make sure you discuss any questions you have with your health care provider.   Document Released: 04/06/2001 Document Revised: 11/01/2014 Document Reviewed: 06/05/2013 Elsevier Interactive Patient Education 2016 Elsevier Inc.  

## 2016-05-03 ENCOUNTER — Ambulatory Visit (INDEPENDENT_AMBULATORY_CARE_PROVIDER_SITE_OTHER): Payer: BC Managed Care – PPO | Admitting: Nurse Practitioner

## 2016-05-03 ENCOUNTER — Encounter: Payer: Self-pay | Admitting: Nurse Practitioner

## 2016-05-03 VITALS — BP 132/92 | HR 72 | Temp 97.6°F | Ht 62.0 in | Wt 192.0 lb

## 2016-05-03 DIAGNOSIS — Z0000 Encounter for general adult medical examination without abnormal findings: Secondary | ICD-10-CM | POA: Diagnosis not present

## 2016-05-03 DIAGNOSIS — J0100 Acute maxillary sinusitis, unspecified: Secondary | ICD-10-CM

## 2016-05-03 DIAGNOSIS — Z01419 Encounter for gynecological examination (general) (routine) without abnormal findings: Secondary | ICD-10-CM

## 2016-05-03 DIAGNOSIS — F988 Other specified behavioral and emotional disorders with onset usually occurring in childhood and adolescence: Secondary | ICD-10-CM

## 2016-05-03 MED ORDER — AZITHROMYCIN 250 MG PO TABS: ORAL_TABLET | ORAL | Status: DC

## 2016-05-03 MED ORDER — LISDEXAMFETAMINE DIMESYLATE 40 MG PO CAPS: 40.0000 mg | ORAL_CAPSULE | ORAL | Status: DC

## 2016-05-03 MED ORDER — LEVONORGESTREL-ETHINYL ESTRAD 0.1-20 MG-MCG PO TABS: 1.0000 | ORAL_TABLET | Freq: Every day | ORAL | Status: DC

## 2016-05-03 NOTE — Progress Notes (Signed)
Subjective:    Patient ID: Shelley Whitehead, female    DOB: February 22, 1991, 25 y.o.   MRN: [id number]  HPI Patient comes in today for CPE and pap exam- she is doing well today, but is complaining  Of cough and congestion that started 3 days ago- denies fever. She also needs refill on [facility] meds- she is on vyvanse 34m daily- helps her to stay calm and fcus- no sie effects.   Review of Systems  Constitutional: Negative for fever and chills.  HENT: Positive for congestion and sinus pressure. Negative for trouble swallowing.   Respiratory: Positive for cough (slight).   Cardiovascular: Negative.   Gastrointestinal: Negative.   Genitourinary: Negative.   Neurological: Negative.   Psychiatric/Behavioral: Negative.   All other systems reviewed and are negative.      Objective:   Physical Exam  Constitutional: She is oriented to person, place, and time. She appears well-developed and well-nourished.  HENT:  Head: Normocephalic.  Right Ear: Hearing, tympanic membrane, external ear and ear canal normal.  Left Ear: Hearing, tympanic membrane, external ear and ear canal normal.  Nose: Mucosal edema and rhinorrhea present. Right sinus exhibits maxillary sinus tenderness. Left sinus exhibits maxillary sinus tenderness.  Mouth/Throat: Uvula is midline and oropharynx is clear and moist.  Eyes: Conjunctivae and EOM are normal. Pupils are equal, round, and reactive to light.  Neck: Normal range of motion and full passive range of motion without pain. Neck supple. No JVD present. Carotid bruit is not present. No thyroid mass and no thyromegaly present.  Cardiovascular: Normal rate, normal heart sounds and intact distal pulses.   No murmur heard. Pulmonary/Chest: Effort normal and breath sounds normal. Right breast exhibits no inverted nipple, no mass, no nipple discharge, no skin change and no tenderness. Left breast exhibits no inverted nipple, no mass, no nipple discharge, no skin change and no  tenderness.  Abdominal: Soft. Bowel sounds are normal. She exhibits no mass. There is no tenderness.  Genitourinary: Vagina normal and uterus normal. No breast swelling, tenderness, discharge or bleeding.  bimanual exam-No adnexal masses or tenderness.   Musculoskeletal: Normal range of motion.  Lymphadenopathy:    She has no cervical adenopathy.  Neurological: She is alert and oriented to person, place, and time.  Skin: Skin is warm and dry.  Psychiatric: She has a normal mood and affect. Her behavior is normal. Judgment and thought content normal.    BP 132/92 mmHg  Pulse 72  Temp(Src) 97.6 F (36.4 C) (Oral)  Ht '5\' 2"'  (1.575 m)  Wt 192 lb (87.091 kg)  BMI 35.11 kg/m2       Assessment & Plan:  1. ADD (attention deficit disorder) Stress management - lisdexamfetamine (VYVANSE) 40 MG capsule; Take 1 capsule (40 mg total) by mouth every morning.  Dispense: 30 capsule; Refill: 0 - lisdexamfetamine (VYVANSE) 40 MG capsule; Take 1 capsule (40 mg total) by mouth every morning.  Dispense: 30 capsule; Refill: 0 - lisdexamfetamine (VYVANSE) 40 MG capsule; Take 1 capsule (40 mg total) by mouth every morning.  Dispense: 30 capsule; Refill: 0  2. Annual physical exam - CBC with Differential/Platelet - CMP14+EGFR - Lipid panel - Thyroid Panel With TSH  3. Encounter for routine gynecological examination - Pap IG, CT/NG w/ reflex HPV when ASC-U  4. Acute maxillary sinusitis, recurrence not specified 1. Take meds as prescribed 2. Use a cool mist humidifier especially during the winter months and when heat has been humid. 3. Use saline nose sprays frequently 4.  Saline irrigations of the nose can be very helpful if done frequently.  * 4X daily for 1 week*  * Use of a nettie pot can be helpful with this. Follow directions with this* 5. Drink plenty of fluids 6. Keep thermostat turn down low 7.For any cough or congestion  Use plain Mucinex- regular strength or max strength is fine   *  Children- consult with Pharmacist for dosing 8. For fever or aces or pains- take tylenol or ibuprofen appropriate for age and weight.  * for fevers greater than 101 orally you may alternate ibuprofen and tylenol every  3 hours.    - azithromycin (ZITHROMAX Z-PAK) 250 MG tablet; As directed  Dispense: 1 each; Refill: 0    Labs pending Health maintenance reviewed Diet and exercise encouraged Continue all meds Follow up  In 4 months   [place], FNP

## 2016-05-03 NOTE — Patient Instructions (Addendum)
Heart-Healthy Eating Plan Many factors influence your heart health, including eating and exercise habits. Heart (coronary) risk increases with abnormal blood fat (lipid) levels. Heart-healthy meal planning includes limiting unhealthy fats, increasing healthy fats, and making other small dietary changes. This includes maintaining a healthy body weight to help keep lipid levels within a normal range. WHAT IS MY PLAN?  Your health care provider recommends that you:  Get no more than ______30___% of the total calories in your daily diet from fat.  Limit your intake of saturated fat to less than ____10_____% of your total calories each day.  Limit the amount of cholesterol in your diet to less than __80_______ mg per day. WHAT TYPES OF FAT SHOULD I CHOOSE?  Choose healthy fats more often. Choose monounsaturated and polyunsaturated fats, such as olive oil and canola oil, flaxseeds, walnuts, almonds, and seeds.  Eat more omega-3 fats. Good choices include salmon, mackerel, sardines, tuna, flaxseed oil, and ground flaxseeds. Aim to eat fish at least two times each week.  Limit saturated fats. Saturated fats are primarily found in animal products, such as meats, butter, and cream. Plant sources of saturated fats include palm oil, palm kernel oil, and coconut oil.  Avoid foods with partially hydrogenated oils in them. These contain trans fats. Examples of foods that contain trans fats are stick margarine, some tub margarines, cookies, crackers, and other baked goods. WHAT GENERAL GUIDELINES DO I NEED TO FOLLOW?  Check food labels carefully to identify foods with trans fats or high amounts of saturated fat.  Fill one half of your plate with vegetables and green salads. Eat 4-5 servings of vegetables per day. A serving of vegetables equals 1 cup of raw leafy vegetables,  cup of raw or cooked cut-up vegetables, or  cup of vegetable juice.  Fill one fourth of your plate with whole grains. Look for the  word "whole" as the first word in the ingredient list.  Fill one fourth of your plate with lean protein foods.  Eat 4-5 servings of fruit per day. A serving of fruit equals one medium whole fruit,  cup of dried fruit,  cup of fresh, frozen, or canned fruit, or  cup of 100% fruit juice.  Eat more foods that contain soluble fiber. Examples of foods that contain this type of fiber are apples, broccoli, carrots, beans, peas, and barley. Aim to get 20-30 g of fiber per day.  Eat more home-cooked food and less restaurant, buffet, and fast food.  Limit or avoid alcohol.  Limit foods that are high in starch and sugar.  Avoid fried foods.  Cook foods by using methods other than frying. Baking, boiling, grilling, and broiling are all great options. Other fat-reducing suggestions include:  Removing the skin from poultry.  Removing all visible fats from meats.  Skimming the fat off of stews, soups, and gravies before serving them.  Steaming vegetables in water or broth.  Lose weight if you are overweight. Losing just 5-10% of your initial body weight can help your overall health and prevent diseases such as diabetes and heart disease.  Increase your consumption of nuts, legumes, and seeds to 4-5 servings per week. One serving of dried beans or legumes equals  cup after being cooked, one serving of nuts equals 1 ounces, and one serving of seeds equals  ounce or 1 tablespoon.  You may need to monitor your salt (sodium) intake, especially if you have high blood pressure. Talk with your health care provider or dietitian to get  more information about reducing sodium. WHAT FOODS CAN I EAT? Grains Breads, including [place], white, pita, wheat, raisin, rye, oatmeal, and [place]. Tortillas that are neither fried nor made with lard or trans fat. Low-fat rolls, including hotdog and hamburger buns and English muffins. Biscuits. Muffins. Waffles. Pancakes. Light popcorn. Whole-grain cereals. Flatbread.  Melba toast. Pretzels. Breadsticks. Rusks. Low-fat snacks and crackers, including oyster, saltine, matzo, graham, animal, and rye. Rice and pasta, including brown rice and those that are made with whole wheat. Vegetables All vegetables. Fruits All fruits, but limit coconut. Meats and Other Protein Sources Lean, well-trimmed beef, veal, pork, and lamb. Chicken and [place] without skin. All fish and shellfish. Wild duck, rabbit, pheasant, and venison. Egg whites or low-cholesterol egg substitutes. Dried beans, peas, lentils, and tofu.Seeds and most nuts. Dairy Low-fat or nonfat cheeses, including ricotta, string, and mozzarella. Skim or 1% milk that is liquid, powdered, or evaporated. Buttermilk that is made with low-fat milk. Nonfat or low-fat yogurt. Beverages Mineral water. Diet carbonated beverages. Sweets and Desserts Sherbets and fruit ices. Honey, jam, marmalade, jelly, and syrups. Meringues and gelatins. Pure sugar candy, such as hard candy, jelly beans, gumdrops, mints, marshmallows, and small amounts of dark chocolate. [employer] Eat all sweets and desserts in moderation. Fats and Oils Nonhydrogenated (trans-free) margarines. Vegetable oils, including soybean, sesame, sunflower, olive, peanut, safflower, corn, canola, and cottonseed. Salad dressings or mayonnaise that are made with a vegetable oil. Limit added fats and oils that you use for cooking, baking, salads, and as spreads. Other Cocoa powder. Coffee and tea. All seasonings and condiments. The items listed above may not be a complete list of recommended foods or beverages. Contact your dietitian for more options. WHAT FOODS ARE NOT RECOMMENDED? Grains Breads that are made with saturated or trans fats, oils, or whole milk. Croissants. Butter rolls. Cheese breads. Sweet rolls. Donuts. Buttered popcorn. Chow mein noodles. High-fat crackers, such as cheese or butter crackers. Meats and Other Protein Sources Fatty meats, such  as hotdogs, short ribs, sausage, spareribs, bacon, ribeye roast or steak, and mutton. High-fat deli meats, such as salami and bologna. Caviar. Domestic duck and goose. Organ meats, such as kidney, liver, sweetbreads, brains, gizzard, chitterlings, and heart. Dairy Cream, sour cream, cream cheese, and creamed cottage cheese. Whole milk cheeses, including blue (bleu), Monte[place] Jack, [place], [place], American, [place], Swiss, [place], [place], and [place]. Whole or 2% milk that is liquid, evaporated, or condensed. Whole buttermilk. Cream sauce or high-fat cheese sauce. Yogurt that is made from whole milk. Beverages Regular sodas and drinks with added sugar. Sweets and Desserts Frosting. Pudding. Cookies. Cakes other than angel food cake. Candy that has milk chocolate or white chocolate, hydrogenated fat, butter, coconut, or unknown ingredients. Buttered syrups. Full-fat ice cream or ice cream drinks. Fats and Oils Gravy that has suet, meat fat, or shortening. Cocoa butter, hydrogenated oils, palm oil, coconut oil, palm kernel oil. These can often be found in baked products, candy, fried foods, nondairy creamers, and whipped toppings. Solid fats and shortenings, including bacon fat, salt pork, lard, and butter. Nondairy cream substitutes, such as coffee creamers and sour cream substitutes. Salad dressings that are made of unknown oils, cheese, or sour cream. The items listed above may not be a complete list of foods and beverages to avoid. Contact your dietitian for more information.   This information is not intended to replace advice given to you by your health care provider. Make sure you discuss any questions you have with your health  care provider.   Document Released: 07/20/2008 Document Revised: 11/01/2014 Document Reviewed: 04/04/2014 Elsevier Interactive Patient Education [employer].

## 2016-05-04 LAB — PAP IG, CT-NG, RFX HPV ASCU
Chlamydia, Nuc. Acid Amp: NEGATIVE
Gonococcus by Nucleic Acid Amp: NEGATIVE
PAP Smear Comment: 0

## 2016-05-04 LAB — CMP14+EGFR
ALBUMIN: 3.9 g/dL (ref 3.5–5.5)
ALK PHOS: 65 IU/L (ref 39–117)
ALT: 17 IU/L (ref 0–32)
AST: 11 IU/L (ref 0–40)
Albumin/Globulin Ratio: 1.3 (ref 1.2–2.2)
BUN / CREAT RATIO: 26 — AB (ref 9–23)
BUN: 18 mg/dL (ref 6–20)
Bilirubin Total: 0.3 mg/dL (ref 0.0–1.2)
CALCIUM: 9.5 mg/dL (ref 8.7–10.2)
CO2: 22 mmol/L (ref 18–29)
CREATININE: 0.69 mg/dL (ref 0.57–1.00)
Chloride: 101 mmol/L (ref 96–106)
GFR calc Af Amer: 140 mL/min/{1.73_m2} (ref >59)
GFR, EST NON AFRICAN AMERICAN: 121 mL/min/{1.73_m2} (ref >59)
GLUCOSE: 99 mg/dL (ref 65–99)
Globulin, Total: 2.9 g/dL (ref 1.5–4.5)
Potassium: 4.8 mmol/L (ref 3.5–5.2)
Sodium: 139 mmol/L (ref 134–144)
Total Protein: 6.8 g/dL (ref 6.0–8.5)

## 2016-05-04 LAB — THYROID PANEL WITH TSH
Free Thyroxine Index: 2 (ref 1.2–4.9)
T3 Uptake Ratio: 20 % — ABNORMAL LOW (ref 24–39)
T4 TOTAL: 9.9 ug/dL (ref 4.5–12.0)
TSH: 3.87 u[IU]/mL (ref 0.450–4.500)

## 2016-05-04 LAB — CBC WITH DIFFERENTIAL/PLATELET
BASOS ABS: 0 10*3/uL (ref 0.0–0.2)
Basos: 0 %
EOS (ABSOLUTE): 0.1 10*3/uL (ref 0.0–0.4)
EOS: 2 %
HEMOGLOBIN: 12.9 g/dL (ref 11.1–15.9)
Hematocrit: 39.1 % (ref 34.0–46.6)
IMMATURE GRANS (ABS): 0 10*3/uL (ref 0.0–0.1)
IMMATURE GRANULOCYTES: 0 %
LYMPHS: 30 %
Lymphocytes Absolute: 1.7 10*3/uL (ref 0.7–3.1)
MCH: 29.3 pg (ref 26.6–33.0)
MCHC: 33 g/dL (ref 31.5–35.7)
MCV: 89 fL (ref 79–97)
MONOCYTES: 9 %
Monocytes Absolute: 0.5 10*3/uL (ref 0.1–0.9)
NEUTROS PCT: 59 %
Neutrophils Absolute: 3.3 10*3/uL (ref 1.4–7.0)
Platelets: 286 10*3/uL (ref 150–379)
RBC: 4.41 x10E6/uL (ref 3.77–5.28)
RDW: 13.7 % (ref 12.3–15.4)
WBC: 5.6 10*3/uL (ref 3.4–10.8)

## 2016-05-04 LAB — LIPID PANEL
CHOL/HDL RATIO: 3.2 ratio (ref 0.0–4.4)
CHOLESTEROL TOTAL: 165 mg/dL (ref 100–199)
HDL: 52 mg/dL (ref >39)
LDL CALC: 96 mg/dL (ref 0–99)
TRIGLYCERIDES: 85 mg/dL (ref 0–149)
VLDL CHOLESTEROL CAL: 17 mg/dL (ref 5–40)

## 2016-10-11 ENCOUNTER — Ambulatory Visit (INDEPENDENT_AMBULATORY_CARE_PROVIDER_SITE_OTHER): Payer: BC Managed Care – PPO | Admitting: Nurse Practitioner

## 2016-10-11 ENCOUNTER — Encounter: Payer: Self-pay | Admitting: Nurse Practitioner

## 2016-10-11 VITALS — BP 122/80 | HR 69 | Temp 98.4°F | Ht 62.0 in | Wt 176.0 lb

## 2016-10-11 DIAGNOSIS — F988 Other specified behavioral and emotional disorders with onset usually occurring in childhood and adolescence: Secondary | ICD-10-CM | POA: Diagnosis not present

## 2016-10-11 MED ORDER — LISDEXAMFETAMINE DIMESYLATE 40 MG PO CAPS: 40.0000 mg | ORAL_CAPSULE | ORAL | 0 refills | Status: DC

## 2016-10-11 NOTE — Progress Notes (Signed)
   Subjective:    Patient ID: Shelley FilterMegan Whitehead, female    DOB: 12/03/1990, 25 y.o.   MRN: [id number]  HPI Patient comes in today for follow up of adult residual ADHD- SHe is currently on vyvanse 40mg  daily- SHe has always had a problem concentrating and this meds helps her to stay focused and get her work done.  Review of Systems  Constitutional: Negative.   HENT: Negative.   Respiratory: Negative.   Cardiovascular: Negative.   Genitourinary: Negative.   Neurological: Negative.   Psychiatric/Behavioral: Negative.   All other systems reviewed and are negative.      Objective:   Physical Exam  Constitutional: She is oriented to person, place, and time. She appears well-developed and well-nourished. No distress.  Cardiovascular: Normal rate, regular rhythm and normal heart sounds.   Pulmonary/Chest: Effort normal and breath sounds normal.  Neurological: She is alert and oriented to person, place, and time.  Skin: Skin is warm.  Psychiatric: She has a normal mood and affect. Her behavior is normal. Judgment and thought content normal.    BP 122/80   Pulse 69   Temp 98.4 F (36.9 C) (Oral)   Ht 5\' 2"  (1.575 m)   Wt 176 lb (79.8 kg)   BMI 32.19 kg/m       Assessment & Plan:  1. Attention deficit disorder (ADD) without hyperactivity stress management - lisdexamfetamine (VYVANSE) 40 MG capsule; Take 1 capsule (40 mg total) by mouth every morning.  Dispense: 30 capsule; Refill: 0 - lisdexamfetamine (VYVANSE) 40 MG capsule; Take 1 capsule (40 mg total) by mouth every morning.  Dispense: 30 capsule; Refill: 0 - lisdexamfetamine (VYVANSE) 40 MG capsule; Take 1 capsule (40 mg total) by mouth every morning.  Dispense: 30 capsule; Refill: 0  Mary-Margaret Daphine DeutscherMartin, FNP

## 2016-11-16 ENCOUNTER — Ambulatory Visit: Payer: BC Managed Care – PPO | Admitting: Nurse Practitioner

## 2017-03-01 ENCOUNTER — Encounter: Payer: Self-pay | Admitting: Nurse Practitioner

## 2017-03-01 ENCOUNTER — Ambulatory Visit (INDEPENDENT_AMBULATORY_CARE_PROVIDER_SITE_OTHER): Payer: BC Managed Care – PPO | Admitting: Nurse Practitioner

## 2017-03-01 DIAGNOSIS — F988 Other specified behavioral and emotional disorders with onset usually occurring in childhood and adolescence: Secondary | ICD-10-CM | POA: Diagnosis not present

## 2017-03-01 MED ORDER — LISDEXAMFETAMINE DIMESYLATE 40 MG PO CAPS: 40.0000 mg | ORAL_CAPSULE | ORAL | 0 refills | Status: DC

## 2017-03-01 NOTE — Patient Instructions (Signed)
Stress and Stress Management Stress is a normal reaction to life events. It is what you feel when life demands more than you are used to or more than you can handle. Some stress can be useful. For example, the stress reaction can help you catch the last bus of the day, study for a test, or meet a deadline at work. But stress that occurs too often or for too long can cause problems. It can affect your emotional health and interfere with relationships and normal daily activities. Too much stress can weaken your immune system and increase your risk for physical illness. If you already have a medical problem, stress can make it worse. What are the causes? All sorts of life events may cause stress. An event that causes stress for one person may not be stressful for another person. Major life events commonly cause stress. These may be positive or negative. Examples include losing your job, moving into a new home, getting married, having a baby, or losing a loved one. Less obvious life events may also cause stress, especially if they occur day after day or in combination. Examples include working long hours, driving in traffic, caring for children, being in debt, or being in a difficult relationship. What are the signs or symptoms? Stress may cause emotional symptoms including, the following:  Anxiety. This is feeling worried, afraid, on edge, overwhelmed, or out of control.  Anger. This is feeling irritated or impatient.  Depression. This is feeling sad, down, helpless, or guilty.  Difficulty focusing, remembering, or making decisions. Stress may cause physical symptoms, including the following:  Aches and pains. These may affect your head, neck, back, stomach, or other areas of your body.  Tight muscles or clenched jaw.  Low energy or trouble sleeping. Stress may cause unhealthy behaviors, including the following:  Eating to feel better (overeating) or skipping meals.  Sleeping too little, too  much, or both.  Working too much or putting off tasks (procrastination).  Smoking, drinking alcohol, or using drugs to feel better. How is this diagnosed? Stress is diagnosed through an assessment by your health care provider. Your health care provider will ask questions about your symptoms and any stressful life events.Your health care provider will also ask about your medical history and may order blood tests or other tests. Certain medical conditions and medicine can cause physical symptoms similar to stress. Mental illness can cause emotional symptoms and unhealthy behaviors similar to stress. Your health care provider may refer you to a mental health professional for further evaluation. How is this treated? Stress management is the recommended treatment for stress.The goals of stress management are reducing stressful life events and coping with stress in healthy ways. Techniques for reducing stressful life events include the following:  Stress identification. Self-monitor for stress and identify what causes stress for you. These skills may help you to avoid some stressful events.  Time management. Set your priorities, keep a calendar of events, and learn to say "no." These tools can help you avoid making too many commitments. Techniques for coping with stress include the following:  Rethinking the problem. Try to think realistically about stressful events rather than ignoring them or overreacting. Try to find the positives in a stressful situation rather than focusing on the negatives.  Exercise. Physical exercise can release both physical and emotional tension. The key is to find a form of exercise you enjoy and do it regularly.  Relaxation techniques. These relax the body and mind. Examples include yoga,  meditation, tai chi, biofeedback, deep breathing, progressive muscle relaxation, listening to music, being out in nature, journaling, and other hobbies. Again, the key is to find one or  more that you enjoy and can do regularly.  Healthy lifestyle. Eat a balanced diet, get plenty of sleep, and do not smoke. Avoid using alcohol or drugs to relax.  Strong support network. Spend time with family, friends, or other people you enjoy being around.Express your feelings and talk things over with someone you trust. Counseling or talktherapy with a mental health professional may be helpful if you are having difficulty managing stress on your own. Medicine is typically not recommended for the treatment of stress.Talk to your health care provider if you think you need medicine for symptoms of stress. Follow these instructions at home:  Keep all follow-up visits as directed by your health care provider.  Take all medicines as directed by your health care provider. Contact a health care provider if:  Your symptoms get worse or you start having new symptoms.  You feel overwhelmed by your problems and can no longer manage them on your own. Get help right away if:  You feel like hurting yourself or someone else. This information is not intended to replace advice given to you by your health care provider. Make sure you discuss any questions you have with your health care provider. Document Released: 04/06/2001 Document Revised: 03/18/2016 Document Reviewed: 06/05/2013 Elsevier Interactive Patient Education  2017 [employer].

## 2017-03-01 NOTE — Progress Notes (Signed)
   Subjective:    Patient ID: Shelley Whitehead, female    DOB: [birth year], 26 y.o.   MRN: [id number]  HPI Patient come sin today for adult residual ADD. She is currently on vyvanse 40mg  daily and is doing well. Helps her to concentrate at work. SHe is having no side effects form medication. Sleeps good at night    Review of Systems  Constitutional: Negative.   Respiratory: Negative.   Cardiovascular: Negative.   Genitourinary: Negative.   Neurological: Negative.   Psychiatric/Behavioral: Negative.   All other systems reviewed and are negative.      Objective:   Physical Exam  Constitutional: She appears well-developed and well-nourished. No distress.  Cardiovascular: Normal rate and regular rhythm.   Pulmonary/Chest: Effort normal.  Neurological: She is alert.  Skin: Skin is warm.  Psychiatric: She has a normal mood and affect. Her behavior is normal. Judgment and thought content normal.    BP 110/65   Pulse 76   Temp 98.2 F (36.8 C) (Oral)   Ht 5\' 2"  (1.575 m)   Wt 194 lb (88 kg)   BMI 35.48 kg/m       Assessment & Plan:   1. Attention deficit disorder (ADD) without hyperactivity    Meds ordered this encounter  Medications  . lisdexamfetamine (VYVANSE) 40 MG capsule    Sig: Take 1 capsule (40 mg total) by mouth every morning.    Dispense:  30 capsule    Refill:  0    Order Specific Question:   Supervising Provider    Answer:   VINCENT, CAROL L [[id number]]  . lisdexamfetamine (VYVANSE) 40 MG capsule    Sig: Take 1 capsule (40 mg total) by mouth every morning.    Dispense:  30 capsule    Refill:  0    DO NOT FILL TILL 03/31/17    Order Specific Question:   Supervising Provider    Answer:   Rex KrasVINCENT, CAROL L [[id number]]  . lisdexamfetamine (VYVANSE) 40 MG capsule    Sig: Take 1 capsule (40 mg total) by mouth every morning.    Dispense:  30 capsule    Refill:  0    DO NOT FILL TILL 04/29/17    Order Specific Question:   Supervising Provider    Answer:   Johna SheriffVINCENT, CAROL L  [[id number]]   Stress management once again encouraged RTO prn  Mary-Margaret Daphine DeutscherMartin, FNP

## 2017-05-24 ENCOUNTER — Other Ambulatory Visit: Payer: Self-pay | Admitting: Nurse Practitioner

## 2017-06-21 ENCOUNTER — Other Ambulatory Visit: Payer: Self-pay | Admitting: Nurse Practitioner

## 2017-08-02 ENCOUNTER — Ambulatory Visit (INDEPENDENT_AMBULATORY_CARE_PROVIDER_SITE_OTHER): Payer: BC Managed Care – PPO | Admitting: Nurse Practitioner

## 2017-08-02 ENCOUNTER — Encounter: Payer: Self-pay | Admitting: Nurse Practitioner

## 2017-08-02 DIAGNOSIS — F988 Other specified behavioral and emotional disorders with onset usually occurring in childhood and adolescence: Secondary | ICD-10-CM | POA: Diagnosis not present

## 2017-08-02 MED ORDER — LISDEXAMFETAMINE DIMESYLATE 40 MG PO CAPS: 40.0000 mg | ORAL_CAPSULE | ORAL | 0 refills | Status: DC

## 2017-08-02 MED ORDER — LEVONORGESTREL-ETHINYL ESTRAD 0.1-20 MG-MCG PO TABS: 1.0000 | ORAL_TABLET | Freq: Every day | ORAL | 11 refills | Status: DC

## 2017-08-02 NOTE — Patient Instructions (Signed)
Stress and Stress Management Stress is a normal reaction to life events. It is what you feel when life demands more than you are used to or more than you can handle. Some stress can be useful. For example, the stress reaction can help you catch the last bus of the day, study for a test, or meet a deadline at work. But stress that occurs too often or for too long can cause problems. It can affect your emotional health and interfere with relationships and normal daily activities. Too much stress can weaken your immune system and increase your risk for physical illness. If you already have a medical problem, stress can make it worse. What are the causes? All sorts of life events may cause stress. An event that causes stress for one person may not be stressful for another person. Major life events commonly cause stress. These may be positive or negative. Examples include losing your job, moving into a new home, getting married, having a baby, or losing a loved one. Less obvious life events may also cause stress, especially if they occur day after day or in combination. Examples include working long hours, driving in traffic, caring for children, being in debt, or being in a difficult relationship. What are the signs or symptoms? Stress may cause emotional symptoms including, the following:  Anxiety. This is feeling worried, afraid, on edge, overwhelmed, or out of control.  Anger. This is feeling irritated or impatient.  Depression. This is feeling sad, down, helpless, or guilty.  Difficulty focusing, remembering, or making decisions.  Stress may cause physical symptoms, including the following:  Aches and pains. These may affect your head, neck, back, stomach, or other areas of your body.  Tight muscles or clenched jaw.  Low energy or trouble sleeping.  Stress may cause unhealthy behaviors, including the following:  Eating to feel better (overeating) or skipping meals.  Sleeping too little,  too much, or both.  Working too much or putting off tasks (procrastination).  Smoking, drinking alcohol, or using drugs to feel better.  How is this diagnosed? Stress is diagnosed through an assessment by your health care provider. Your health care provider will ask questions about your symptoms and any stressful life events.Your health care provider will also ask about your medical history and may order blood tests or other tests. Certain medical conditions and medicine can cause physical symptoms similar to stress. Mental illness can cause emotional symptoms and unhealthy behaviors similar to stress. Your health care provider may refer you to a mental health professional for further evaluation. How is this treated? Stress management is the recommended treatment for stress.The goals of stress management are reducing stressful life events and coping with stress in healthy ways. Techniques for reducing stressful life events include the following:  Stress identification. Self-monitor for stress and identify what causes stress for you. These skills may help you to avoid some stressful events.  Time management. Set your priorities, keep a calendar of events, and learn to say "no." These tools can help you avoid making too many commitments.  Techniques for coping with stress include the following:  Rethinking the problem. Try to think realistically about stressful events rather than ignoring them or overreacting. Try to find the positives in a stressful situation rather than focusing on the negatives.  Exercise. Physical exercise can release both physical and emotional tension. The key is to find a form of exercise you enjoy and do it regularly.  Relaxation techniques. These relax the body and  mind. Examples include yoga, meditation, tai chi, biofeedback, deep breathing, progressive muscle relaxation, listening to music, being out in nature, journaling, and other hobbies. Again, the key is to find  one or more that you enjoy and can do regularly.  Healthy lifestyle. Eat a balanced diet, get plenty of sleep, and do not smoke. Avoid using alcohol or drugs to relax.  Strong support network. Spend time with family, friends, or other people you enjoy being around.Express your feelings and talk things over with someone you trust.  Counseling or talktherapy with a mental health professional may be helpful if you are having difficulty managing stress on your own. Medicine is typically not recommended for the treatment of stress.Talk to your health care provider if you think you need medicine for symptoms of stress. Follow these instructions at home:  Keep all follow-up visits as directed by your health care provider.  Take all medicines as directed by your health care provider. Contact a health care provider if:  Your symptoms get worse or you start having new symptoms.  You feel overwhelmed by your problems and can no longer manage them on your own. Get help right away if:  You feel like hurting yourself or someone else. This information is not intended to replace advice given to you by your health care provider. Make sure you discuss any questions you have with your health care provider. Document Released: 04/06/2001 Document Revised: 03/18/2016 Document Reviewed: 06/05/2013 Elsevier Interactive Patient Education  2017 [employer]  

## 2017-08-02 NOTE — Progress Notes (Signed)
   Subjective:    Patient ID: Shelley Whitehead, female    DOB: [birth year], 26 y.o.   MRN: [id number]  HPI Patient in today for follow up of adult residual ADHD. 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 She Is doing wwell without medication side effects.    Review of Systems  Constitutional: Negative.   Respiratory: Negative.   Cardiovascular: Negative.   Gastrointestinal: Negative.   Neurological: Negative.   Psychiatric/Behavioral: Negative.   All other systems reviewed and are negative.      Objective:   Physical Exam  Constitutional: She is oriented to person, place, and time. She appears well-developed and well-nourished.  Cardiovascular: Normal rate and regular rhythm.   Pulmonary/Chest: Effort normal and breath sounds normal.  Neurological: She is alert and oriented to person, place, and time.  Skin: Skin is warm.  Psychiatric: She has a normal mood and affect. Her behavior is normal. Judgment and thought content normal.   BP 119/72   Pulse 82   Temp 99.2 F (37.3 C) (Oral)   Ht  (1.575 m)   Wt 208 lb 6.4 oz (94.5 kg)   BMI 38.12 kg/m        Assessment & Plan:  1. Attention deficit disorder (ADD) without hyperactivity Stress management - lisdexamfetamine (VYVANSE) 40 MG capsule; Take 1 capsule (40 mg total) by mouth every morning.  Dispense: 30 capsule; Refill: 0 - lisdexamfetamine (VYVANSE) 40 MG capsule; Take 1 capsule (40 mg total) by mouth every morning.  Dispense: 30 capsule; Refill: 0 - lisdexamfetamine (VYVANSE) 40 MG capsule; Take 1 capsule (40 mg total) by mouth every morning.  Dispense: 30 capsule; Refill: 0    Labs pending Health maintenance reviewed Diet and exercise encouraged Continue all meds Follow up  In 3 months   Mary-Margaret Daphine Deutscher, FNP

## 2017-09-20 ENCOUNTER — Ambulatory Visit: Payer: BC Managed Care – PPO | Admitting: Nurse Practitioner

## 2017-09-20 ENCOUNTER — Encounter: Payer: Self-pay | Admitting: Nurse Practitioner

## 2017-09-20 VITALS — BP 125/86 | HR 89 | Temp 98.0°F | Ht 62.0 in | Wt 192.0 lb

## 2017-09-20 DIAGNOSIS — N920 Excessive and frequent menstruation with regular cycle: Secondary | ICD-10-CM

## 2017-09-20 DIAGNOSIS — F332 Major depressive disorder, recurrent severe without psychotic features: Secondary | ICD-10-CM | POA: Diagnosis not present

## 2017-09-20 MED ORDER — NORETHIN ACE-ETH ESTRAD-FE 1.5-30 MG-MCG PO TABS: 1.0000 | ORAL_TABLET | Freq: Every day | ORAL | 11 refills | Status: DC

## 2017-09-20 MED ORDER — SERTRALINE HCL 100 MG PO TABS: 100.0000 mg | ORAL_TABLET | Freq: Every day | ORAL | 5 refills | Status: DC

## 2017-09-20 NOTE — Patient Instructions (Signed)
Stress and Stress Management Stress is a normal reaction to life events. It is what you feel when life demands more than you are used to or more than you can handle. Some stress can be useful. For example, the stress reaction can help you catch the last bus of the day, study for a test, or meet a deadline at work. But stress that occurs too often or for too long can cause problems. It can affect your emotional health and interfere with relationships and normal daily activities. Too much stress can weaken your immune system and increase your risk for physical illness. If you already have a medical problem, stress can make it worse. What are the causes? All sorts of life events may cause stress. An event that causes stress for one person may not be stressful for another person. Major life events commonly cause stress. These may be positive or negative. Examples include losing your job, moving into a new home, getting married, having a baby, or losing a loved one. Less obvious life events may also cause stress, especially if they occur day after day or in combination. Examples include working long hours, driving in traffic, caring for children, being in debt, or being in a difficult relationship. What are the signs or symptoms? Stress may cause emotional symptoms including, the following:  Anxiety. This is feeling worried, afraid, on edge, overwhelmed, or out of control.  Anger. This is feeling irritated or impatient.  Depression. This is feeling sad, down, helpless, or guilty.  Difficulty focusing, remembering, or making decisions.  Stress may cause physical symptoms, including the following:  Aches and pains. These may affect your head, neck, back, stomach, or other areas of your body.  Tight muscles or clenched jaw.  Low energy or trouble sleeping.  Stress may cause unhealthy behaviors, including the following:  Eating to feel better (overeating) or skipping meals.  Sleeping too little,  too much, or both.  Working too much or putting off tasks (procrastination).  Smoking, drinking alcohol, or using drugs to feel better.  How is this diagnosed? Stress is diagnosed through an assessment by your health care provider. Your health care provider will ask questions about your symptoms and any stressful life events.Your health care provider will also ask about your medical history and may order blood tests or other tests. Certain medical conditions and medicine can cause physical symptoms similar to stress. Mental illness can cause emotional symptoms and unhealthy behaviors similar to stress. Your health care provider may refer you to a mental health professional for further evaluation. How is this treated? Stress management is the recommended treatment for stress.The goals of stress management are reducing stressful life events and coping with stress in healthy ways. Techniques for reducing stressful life events include the following:  Stress identification. Self-monitor for stress and identify what causes stress for you. These skills may help you to avoid some stressful events.  Time management. Set your priorities, keep a calendar of events, and learn to say "no." These tools can help you avoid making too many commitments.  Techniques for coping with stress include the following:  Rethinking the problem. Try to think realistically about stressful events rather than ignoring them or overreacting. Try to find the positives in a stressful situation rather than focusing on the negatives.  Exercise. Physical exercise can release both physical and emotional tension. The key is to find a form of exercise you enjoy and do it regularly.  Relaxation techniques. These relax the body and  mind. Examples include yoga, meditation, tai chi, biofeedback, deep breathing, progressive muscle relaxation, listening to music, being out in nature, journaling, and other hobbies. Again, the key is to find  one or more that you enjoy and can do regularly.  Healthy lifestyle. Eat a balanced diet, get plenty of sleep, and do not smoke. Avoid using alcohol or drugs to relax.  Strong support network. Spend time with family, friends, or other people you enjoy being around.Express your feelings and talk things over with someone you trust.  Counseling or talktherapy with a mental health professional may be helpful if you are having difficulty managing stress on your own. Medicine is typically not recommended for the treatment of stress.Talk to your health care provider if you think you need medicine for symptoms of stress. Follow these instructions at home:  Keep all follow-up visits as directed by your health care provider.  Take all medicines as directed by your health care provider. Contact a health care provider if:  Your symptoms get worse or you start having new symptoms.  You feel overwhelmed by your problems and can no longer manage them on your own. Get help right away if:  You feel like hurting yourself or someone else. This information is not intended to replace advice given to you by your health care provider. Make sure you discuss any questions you have with your health care provider. Document Released: 04/06/2001 Document Revised: 03/18/2016 Document Reviewed: 06/05/2013 Elsevier Interactive Patient Education  2017 [employer]  

## 2017-09-20 NOTE — Progress Notes (Addendum)
   Subjective:    Patient ID: Shelley FilterMegan Penland, female    DOB: [birth year], 26 y.o.   MRN: [id number]  HPI  Patient comes in with 2 complaints: - excessive menustral bleeding the last 2 cycles-lasting longer then 10 days with very heavy bleeding. vVenva birth control for 2-3 years. She has been under a lot  stress at work and in personal life. - panic attacks- had bad situation at work yesterday and she just walked out at 11am yesterday. Said she flt like packing her stuff and just leaving town and starting her life over again. Then she started having suicidal thoughts. Says she is having suicidal thoughts now as well. But says that she could not do anything to herself. She has had physical symptoms with her anxiety, nausea, poor appetite. [facility] 09/20/2017 08/02/2017 03/01/2017 10/11/2016 05/03/2016  Decreased Interest 3 0 0 1 0  Down, Depressed, Hopeless 3 0 0 1 0  PHQ - 2 Score 6 0 0 2 0  Altered sleeping 3 - - 1 -  Tired, decreased energy 3 - - 1 -  Change in appetite 3 - - 1 -  Feeling bad or failure about yourself  3 - - 0 -  Trouble concentrating 1 - - 0 -  Moving slowly or fidgety/restless 0 - - 0 -  Suicidal thoughts 3 - - 0 -  PHQ-9 Score 22 - - 5 -     Review of Systems  Constitutional: Negative.   Respiratory: Negative.   Cardiovascular: Negative.   Genitourinary: Negative.   Neurological: Negative.   Psychiatric/Behavioral: Positive for sleep disturbance and suicidal ideas. The patient is nervous/anxious.   All other systems reviewed and are negative.      Objective:   Physical Exam  Constitutional: She is oriented to person, place, and time. She appears well-developed and well-nourished. She appears distressed.  Cardiovascular: Normal rate and regular rhythm.  Pulmonary/Chest: Effort normal and breath sounds normal.  Neurological: She is alert and oriented to person, place, and time.  Skin: Skin is warm.  Psychiatric: She has a normal mood and affect.  Her behavior is normal. Judgment and thought content normal.   BP 125/86   Pulse 89   Temp 98 F (36.7 C) (Oral)   Ht 5\' 2"  (1.575 m)   Wt 192 lb (87.1 kg)   BMI 35.12 kg/m       Assessment & Plan:   1. Severe episode of recurrent major depressive disorder, without psychotic features [facility](HCC) Stress management Called mom and told her not to leave her alone tonight- patient is to go directly to her mom house from here. Mom is to cll  Me if not showing up. - sertraline (ZOLOFT) 100 MG tablet; Take 1 tablet (100 mg total) by mouth daily.  Dispense: 30 tablet; Refill: 5  2. Menorrhagia with regular cycle Changed birth control pills Start new pack after next cycle - norethindrone-ethinyl estradiol-iron (MICROGESTIN FE,GILDESS FE,LOESTRIN FE) 1.5-30 MG-MCG tablet; Take 1 tablet by mouth daily.  Dispense: 1 Package; Refill: 11  Mary-Margaret Daphine DeutscherMartin, FNP

## 2017-09-27 ENCOUNTER — Other Ambulatory Visit: Payer: BC Managed Care – PPO | Admitting: Nurse Practitioner

## 2017-10-06 ENCOUNTER — Encounter: Payer: Self-pay | Admitting: Nurse Practitioner

## 2017-10-06 ENCOUNTER — Ambulatory Visit: Payer: BC Managed Care – PPO | Admitting: Nurse Practitioner

## 2017-10-06 VITALS — BP 115/78 | HR 78 | Temp 99.2°F | Ht 62.0 in | Wt 190.0 lb

## 2017-10-06 DIAGNOSIS — F332 Major depressive disorder, recurrent severe without psychotic features: Secondary | ICD-10-CM

## 2017-10-06 NOTE — Progress Notes (Signed)
   Subjective:    Patient ID: Shelley Whitehead, female    DOB: 1991/01/24, 26 y.o.   MRN: [id number]  HPI  Patient comes in today for follow up- she was seen 2 weeks ago and was very upset and having suicidal thoughts. Refused to go  To hospital, lso I contacted her mom and she said she would stay with hr until she was better. We started her back on zoloft 100 mg daily. She is some bettter today but has not been on meds yet to fully get effect. She denies any side effects. [facility] 10/06/2017 09/20/2017 08/02/2017  Decreased Interest 2 3 0  Down, Depressed, Hopeless 2 3 0  PHQ - 2 Score 4 6 0  Altered sleeping 2 3 -  Tired, decreased energy 2 3 -  Change in appetite 2 3 -  Feeling bad or failure about yourself  2 3 -  Trouble concentrating 1 1 -  Moving slowly or fidgety/restless 0 0 -  Suicidal thoughts 2 3 -  PHQ-9 Score 15 22 -     Review of Systems  Constitutional: Negative for activity change and appetite change.  HENT: Negative.   Eyes: Negative for pain.  Respiratory: Negative for shortness of breath.   Cardiovascular: Negative for chest pain, palpitations and leg swelling.  Gastrointestinal: Negative for abdominal pain.  Endocrine: Negative for polydipsia.  Genitourinary: Negative.   Skin: Negative for rash.  Neurological: Negative for dizziness, weakness and headaches.  Hematological: Does not bruise/bleed easily.  Psychiatric/Behavioral: Negative.   All other systems reviewed and are negative.      Objective:   Physical Exam  Constitutional: She is oriented to person, place, and time. She appears well-developed and well-nourished.  HENT:  Nose: Nose normal.  Mouth/Throat: Oropharynx is clear and moist.  Eyes: EOM are normal.  Neck: Trachea normal, normal range of motion and full passive range of motion without pain. Neck supple. No JVD present. Carotid bruit is not present. No thyromegaly present.  Cardiovascular: Normal rate, regular rhythm, normal  heart sounds and intact distal pulses. Exam reveals no gallop and no friction rub.  No murmur heard. Pulmonary/Chest: Effort normal and breath sounds normal.  Abdominal: Soft. Bowel sounds are normal. She exhibits no distension and no mass. There is no tenderness.  Musculoskeletal: Normal range of motion.  Lymphadenopathy:    She has no cervical adenopathy.  Neurological: She is alert and oriented to person, place, and time. She has normal reflexes.  Skin: Skin is warm and dry.  Psychiatric: She has a normal mood and affect. Her behavior is normal. Judgment and thought content normal.   BP 115/78   Pulse 78   Temp 99.2 F (37.3 C) (Oral)   Ht 5\' 2"  (1.575 m)   Wt 190 lb (86.2 kg)   BMI 34.75 kg/m       Assessment & Plan:   1. Severe episode of recurrent major depressive disorder, without psychotic features (HCC)    Doing much better continue stress management Continue zoloft as rx RTO prn  Mary-Margaret Daphine DeutscherMartin, FNP

## 2017-10-19 ENCOUNTER — Other Ambulatory Visit: Payer: Self-pay

## 2017-10-19 DIAGNOSIS — F332 Major depressive disorder, recurrent severe without psychotic features: Secondary | ICD-10-CM

## 2017-10-19 MED ORDER — SERTRALINE HCL 100 MG PO TABS: 100.0000 mg | ORAL_TABLET | Freq: Every day | ORAL | 0 refills | Status: DC

## 2017-12-20 ENCOUNTER — Ambulatory Visit: Payer: BC Managed Care – PPO | Admitting: Nurse Practitioner

## 2017-12-20 ENCOUNTER — Encounter: Payer: Self-pay | Admitting: Nurse Practitioner

## 2017-12-20 DIAGNOSIS — F988 Other specified behavioral and emotional disorders with onset usually occurring in childhood and adolescence: Secondary | ICD-10-CM

## 2017-12-20 MED ORDER — LISDEXAMFETAMINE DIMESYLATE 40 MG PO CAPS: 40.0000 mg | ORAL_CAPSULE | ORAL | 0 refills | Status: DC

## 2017-12-20 MED ORDER — LISDEXAMFETAMINE DIMESYLATE 40 MG PO CAPS
40.0000 mg | ORAL_CAPSULE | ORAL | 0 refills | Status: DC
Start: 2017-12-20 — End: 2018-05-19

## 2017-12-20 NOTE — Patient Instructions (Signed)
Stress and Stress Management Stress is a normal reaction to life events. It is what you feel when life demands more than you are used to or more than you can handle. Some stress can be useful. For example, the stress reaction can help you catch the last bus of the day, study for a test, or meet a deadline at work. But stress that occurs too often or for too long can cause problems. It can affect your emotional health and interfere with relationships and normal daily activities. Too much stress can weaken your immune system and increase your risk for physical illness. If you already have a medical problem, stress can make it worse. What are the causes? All sorts of life events may cause stress. An event that causes stress for one person may not be stressful for another person. Major life events commonly cause stress. These may be positive or negative. Examples include losing your job, moving into a new home, getting married, having a baby, or losing a loved one. Less obvious life events may also cause stress, especially if they occur day after day or in combination. Examples include working long hours, driving in traffic, caring for children, being in debt, or being in a difficult relationship. What are the signs or symptoms? Stress may cause emotional symptoms including, the following:  Anxiety. This is feeling worried, afraid, on edge, overwhelmed, or out of control.  Anger. This is feeling irritated or impatient.  Depression. This is feeling sad, down, helpless, or guilty.  Difficulty focusing, remembering, or making decisions.  Stress may cause physical symptoms, including the following:  Aches and pains. These may affect your head, neck, back, stomach, or other areas of your body.  Tight muscles or clenched jaw.  Low energy or trouble sleeping.  Stress may cause unhealthy behaviors, including the following:  Eating to feel better (overeating) or skipping meals.  Sleeping too little,  too much, or both.  Working too much or putting off tasks (procrastination).  Smoking, drinking alcohol, or using drugs to feel better.  How is this diagnosed? Stress is diagnosed through an assessment by your health care provider. Your health care provider will ask questions about your symptoms and any stressful life events.Your health care provider will also ask about your medical history and may order blood tests or other tests. Certain medical conditions and medicine can cause physical symptoms similar to stress. Mental illness can cause emotional symptoms and unhealthy behaviors similar to stress. Your health care provider may refer you to a mental health professional for further evaluation. How is this treated? Stress management is the recommended treatment for stress.The goals of stress management are reducing stressful life events and coping with stress in healthy ways. Techniques for reducing stressful life events include the following:  Stress identification. Self-monitor for stress and identify what causes stress for you. These skills may help you to avoid some stressful events.  Time management. Set your priorities, keep a calendar of events, and learn to say "no." These tools can help you avoid making too many commitments.  Techniques for coping with stress include the following:  Rethinking the problem. Try to think realistically about stressful events rather than ignoring them or overreacting. Try to find the positives in a stressful situation rather than focusing on the negatives.  Exercise. Physical exercise can release both physical and emotional tension. The key is to find a form of exercise you enjoy and do it regularly.  Relaxation techniques. These relax the body and  mind. Examples include yoga, meditation, tai chi, biofeedback, deep breathing, progressive muscle relaxation, listening to music, being out in nature, journaling, and other hobbies. Again, the key is to find  one or more that you enjoy and can do regularly.  Healthy lifestyle. Eat a balanced diet, get plenty of sleep, and do not smoke. Avoid using alcohol or drugs to relax.  Strong support network. Spend time with family, friends, or other people you enjoy being around.Express your feelings and talk things over with someone you trust.  Counseling or talktherapy with a mental health professional may be helpful if you are having difficulty managing stress on your own. Medicine is typically not recommended for the treatment of stress.Talk to your health care provider if you think you need medicine for symptoms of stress. Follow these instructions at home:  Keep all follow-up visits as directed by your health care provider.  Take all medicines as directed by your health care provider. Contact a health care provider if:  Your symptoms get worse or you start having new symptoms.  You feel overwhelmed by your problems and can no longer manage them on your own. Get help right away if:  You feel like hurting yourself or someone else. This information is not intended to replace advice given to you by your health care provider. Make sure you discuss any questions you have with your health care provider. Document Released: 04/06/2001 Document Revised: 03/18/2016 Document Reviewed: 06/05/2013 Elsevier Interactive Patient Education  2017 [employer]  

## 2017-12-20 NOTE — Progress Notes (Signed)
   Subjective:    Patient ID: Shelley Whitehead, female    DOB: [birth year], 27 y.o.   MRN: [id number]  HPI Patient comes in today for follow up of: -  Depression- she was seen on 09/20/17 with severe depression with suicidal thoughts. Her and her live on boyfriend had just broken up and she was not doing well. We started her on zoloft. She had follow up on 10/06/17 was dong much better. Was still sad but doing over all better. She sy=topped taking hr zolft 2 weeks ago and is stil doing okay - .adukt adhd- she is on vyvanse 40mg  daiy. She can not sty calm and concentrate at work without it. No side effects from medication.  Review of Systems  Constitutional: Negative for activity change and appetite change.  HENT: Negative.   Eyes: Negative for pain.  Respiratory: Negative for shortness of breath.   Cardiovascular: Negative for chest pain, palpitations and leg swelling.  Gastrointestinal: Negative for abdominal pain.  Endocrine: Negative for polydipsia.  Genitourinary: Negative.   Skin: Negative for rash.  Neurological: Negative for dizziness, weakness and headaches.  Hematological: Does not bruise/bleed easily.  Psychiatric/Behavioral: Negative for behavioral problems, sleep disturbance and suicidal ideas. The patient is not nervous/anxious.   All other systems reviewed and are negative.      Objective:   Physical Exam  Constitutional: She appears well-developed and well-nourished. No distress.  Cardiovascular: Normal rate and regular rhythm.  Pulmonary/Chest: Effort normal and breath sounds normal.  Neurological: She is alert.  Skin: Skin is warm and dry.  Psychiatric: She has a normal mood and affect. Her behavior is normal. Judgment and thought content normal.   BP 116/69   Pulse 75   Temp 97.8 F (36.6 C) (Oral)   Ht 5\' 2"  (1.575 m)   Wt 189 lb (85.7 kg)   BMI 34.57 kg/m       Assessment & Plan:  1. Attention deficit disorder (ADD) without hyperactivity Stress  management - lisdexamfetamine (VYVANSE) 40 MG capsule; Take 1 capsule (40 mg total) by mouth every morning.  Dispense: 30 capsule; Refill: 0 - lisdexamfetamine (VYVANSE) 40 MG capsule; Take 1 capsule (40 mg total) by mouth every morning.  Dispense: 30 capsule; Refill: 0 - lisdexamfetamine (VYVANSE) 40 MG capsule; Take 1 capsule (40 mg total) by mouth every morning.  Dispense: 30 capsule; Refill: 0  Mary-Margaret Daphine DeutscherMartin, FNP

## 2018-01-18 ENCOUNTER — Ambulatory Visit (INDEPENDENT_AMBULATORY_CARE_PROVIDER_SITE_OTHER): Payer: BC Managed Care – PPO

## 2018-01-18 ENCOUNTER — Encounter: Payer: Self-pay | Admitting: Nurse Practitioner

## 2018-01-18 ENCOUNTER — Ambulatory Visit: Payer: BC Managed Care – PPO | Admitting: Nurse Practitioner

## 2018-01-18 VITALS — BP 119/74 | HR 81 | Temp 97.7°F | Ht 62.0 in | Wt 190.0 lb

## 2018-01-18 DIAGNOSIS — R1032 Left lower quadrant pain: Secondary | ICD-10-CM | POA: Diagnosis not present

## 2018-01-18 DIAGNOSIS — N926 Irregular menstruation, unspecified: Secondary | ICD-10-CM | POA: Diagnosis not present

## 2018-01-18 LAB — URINALYSIS, COMPLETE
Bilirubin, UA: NEGATIVE
GLUCOSE, UA: NEGATIVE
Ketones, UA: NEGATIVE
Leukocytes, UA: NEGATIVE
Nitrite, UA: NEGATIVE
PROTEIN UA: NEGATIVE
Specific Gravity, UA: 1.01 (ref 1.005–1.030)
UUROB: 0.2 mg/dL (ref 0.2–1.0)
pH, UA: 6 (ref 5.0–7.5)

## 2018-01-18 LAB — MICROSCOPIC EXAMINATION

## 2018-01-18 LAB — PREGNANCY, URINE: PREG TEST UR: NEGATIVE

## 2018-01-18 MED ORDER — MEDROXYPROGESTERONE ACETATE 10 MG PO TABS: 10.0000 mg | ORAL_TABLET | Freq: Every day | ORAL | 0 refills | Status: DC

## 2018-01-18 NOTE — Patient Instructions (Signed)
Ileus  Ileus is a condition in which the intestines, also called the bowels, stop working and moving correctly. If the intestines stop working, food cannot pass through to get digested. The intestines are hollow organs that digest food after the food leaves the stomach. These organs are long, muscular tubes that connect the stomach to the rectum. When ileus occurs, the muscular contractions that cause food to move through the intestines stop happening as they normally would.  Ileus can occur for various reasons. This condition is a serious problem that usually requires hospitalization. It can cause symptoms such as nausea, abdominal pain, and bloating. Ileus can last from a few hours to a few days. If the intestines stop working because of a blockage, that is a different condition that is called a bowel obstruction.  What are the causes?  This condition may be caused by:  · Surgery on the abdomen.  · An infection or inflammation in the abdomen. This includes inflammation of the lining of the abdomen (peritonitis).  · Infection or inflammation in other parts of the body, such as pneumonia or pancreatitis.  · Passage of gallstones or kidney stones.  · Damage to the nerves or blood vessels that go to the intestines.  · A collection of blood within the abdominal cavity.  · Imbalance in the salts in the blood (electrolytes).  · Injury to the brain or spinal cord.  · Medicines. Many medicines, including strong pain medicines, can cause ileus or make it worse.    What are the signs or symptoms?  Symptoms of this condition include:  · Bloating of the abdomen.  · Pain or discomfort in the abdomen.  · Poor appetite.  · Nausea and vomiting.  · Lack of normal bowel sounds, such as “growling" in the stomach.    How is this diagnosed?  This condition may be diagnosed with:  · A physical exam and medical history.  · X-rays or a CT scan of the abdomen.    You may also have other tests to help find the cause of the condition.  How  is this treated?  Treatment for this condition may include:  · Resting the intestines until they start to work again. This is often done by:  ? Stopping oral intake of food and drink. You will be given fluid through an IV tube to prevent dehydration.  ? Placing a small tube (nasogastric tube or NG tube) that is passed through your nose and into your stomach. The tube is attached to a suction device and keeps the stomach emptied out. This allows the bowels to rest and also helps to reduce nausea and vomiting.  · Correcting any electrolyte imbalance by giving supplements in the IV fluid.  · Stopping any medicines that might make ileus worse.  · Treating any condition that may have caused ileus.    Follow these instructions at home:  · Follow instructions from your health care provider about diet and fluid intake. Usually, you will be told to:  ? Drink plenty of clear fluids.  ? Avoid alcohol.  ? Avoid caffeine.  ? Eat a bland diet.  · Get plenty of rest. Return to your normal activities as told by your health care provider.  · Take over-the-counter and prescription medicines only as told by your health care provider.  · Keep all follow-up visits as told by your health care provider. This is important.  Contact a health care provider if:  · You have nausea, vomiting,   or abdominal discomfort.  · You have a fever.  Get help right away if:  · You have severe abdominal pain or bloating.  · You cannot eat or drink without vomiting.  This information is not intended to replace advice given to you by your health care provider. Make sure you discuss any questions you have with your health care provider.  Document Released: 10/14/2003 Document Revised: 03/18/2016 Document Reviewed: 12/05/2014  Elsevier Interactive Patient Education © 2018 Elsevier Inc.

## 2018-01-18 NOTE — Progress Notes (Addendum)
   Subjective:    Patient ID: Shelley Whitehead, female    DOB: Sep 30, 1991, 27 y.o.   MRN: [id number]  HPI Shelley Whitehead comes in today c/o left lower quadrant pain.pain is intermittent. 2 weeks ago she had it and it last a couple of hours. Started again yesterday and lasted 4-5 hours. Rates pain 4/10 and describes as achy. Not a stabbing pain. Has not had today.  Her last period was 3 months ago and she did a home pregnancy test last month which was negative and she has not had sex since then. She does not recall ever having constipation. Denies staring to have bowel movement. She is on microgestin birth control pills.     Review of Systems  Constitutional: Negative for activity change and appetite change.  HENT: Negative.   Eyes: Negative for pain.  Respiratory: Negative for shortness of breath.   Cardiovascular: Negative for chest pain, palpitations and leg swelling.  Gastrointestinal: Positive for abdominal pain.  Endocrine: Negative for polydipsia.  Genitourinary: Negative.   Skin: Negative for rash.  Neurological: Negative for dizziness, weakness and headaches.  Hematological: Does not bruise/bleed easily.  Psychiatric/Behavioral: Negative.   All other systems reviewed and are negative.      Objective:   Physical Exam  Constitutional: She appears well-developed and well-nourished. No distress.  Cardiovascular: Normal rate and regular rhythm.  Pulmonary/Chest: Effort normal.  Abdominal: Soft. There is tenderness (left lower quadrant).  Skin: Skin is warm.  Psychiatric: She has a normal mood and affect. Her behavior is normal. Judgment and thought content normal.   BP 119/74   Pulse 81   Temp 97.7 F (36.5 C) (Oral)   Ht 5\' 2"  (1.575 m)   Wt 190 lb (86.2 kg)   BMI 34.75 kg/m   Urine clear Urine preg negative KUB- ileus of small intestines-Preliminary reading by Paulene FloorMary Raden Byington, FNP  [facility]       Assessment & Plan:  1. Left lower quadrant pain Possible ileus Force fluids  Move  around - Urinalysis, Complete - DG Abd 1 View; Future - medroxyPROGESTERone (PROVERA) 10 MG tablet; Take 1 tablet (10 mg total) by mouth daily.  Dispense: 10 tablet; Refill: 0  2. Missed period Meds ordered this encounter  Medications  . medroxyPROGESTERone (PROVERA) 10 MG tablet    Sig: Take 1 tablet (10 mg total) by mouth daily.    Dispense:  10 tablet    Refill:  0    Order Specific Question:   Supervising Provider    Answer:   Oswaldo DoneVINCENT, CAROL L [[id number]]   Stop birthcontrol and have period then restart - Pregnancy, urine  Mary-Margaret Daphine DeutscherMartin, FNP  * radiology called and report small bowel ileus on left- will watch and if ds not improve will need to do CT scan. Mary-Margaret Daphine DeutscherMartin, FNP

## 2018-02-11 ENCOUNTER — Other Ambulatory Visit: Payer: Self-pay | Admitting: Nurse Practitioner

## 2018-02-11 DIAGNOSIS — F332 Major depressive disorder, recurrent severe without psychotic features: Secondary | ICD-10-CM

## 2018-04-20 ENCOUNTER — Other Ambulatory Visit: Payer: Self-pay | Admitting: Nurse Practitioner

## 2018-04-20 DIAGNOSIS — N920 Excessive and frequent menstruation with regular cycle: Secondary | ICD-10-CM

## 2018-05-19 ENCOUNTER — Encounter: Payer: Self-pay | Admitting: Nurse Practitioner

## 2018-05-19 ENCOUNTER — Ambulatory Visit: Payer: BC Managed Care – PPO | Admitting: Nurse Practitioner

## 2018-05-19 VITALS — BP 109/67 | HR 66 | Temp 97.3°F | Ht 62.0 in | Wt 199.0 lb

## 2018-05-19 DIAGNOSIS — F332 Major depressive disorder, recurrent severe without psychotic features: Secondary | ICD-10-CM

## 2018-05-19 DIAGNOSIS — F988 Other specified behavioral and emotional disorders with onset usually occurring in childhood and adolescence: Secondary | ICD-10-CM | POA: Diagnosis not present

## 2018-05-19 MED ORDER — LISDEXAMFETAMINE DIMESYLATE 40 MG PO CAPS: 40.0000 mg | ORAL_CAPSULE | ORAL | 0 refills | Status: DC

## 2018-05-19 NOTE — Progress Notes (Signed)
Subjective:    Patient ID: Shelley Whitehead, female    DOB: [birth year], 27 y.o.   MRN: [id number]   Chief Complaint: Recheck ADHD   HPI - ADHD- patient has long history of ADHD. She has been on vyvanse 40mg  daily for several years. She is doing well. No medication side effects. 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  - depression- episodic- she had a bad break up last year that caused depression, but she is doing well now. Depression screen [facility] 2/9 05/19/2018 01/18/2018 10/06/2017  Decreased Interest 0 0 2  Down, Depressed, Hopeless 0 0 2  PHQ - 2 Score 0 0 4  Altered sleeping - - 2  Tired, decreased energy - - 2  Change in appetite - - 2  Feeling bad or failure about yourself  - - 2  Trouble concentrating - - 1  Moving slowly or fidgety/restless - - 0  Suicidal thoughts - - 2  PHQ-9 Score - - 15      Review of Systems  Constitutional: Negative for activity change and appetite change.  HENT: Negative.   Eyes: Negative for pain.  Respiratory: Negative for shortness of breath.   Cardiovascular: Negative for chest pain, palpitations and leg swelling.  Gastrointestinal: Negative for abdominal pain.  Endocrine: Negative for polydipsia.  Genitourinary: Negative.   Skin: Negative for rash.  Neurological: Negative for dizziness, weakness and headaches.  Hematological: Does not bruise/bleed easily.  Psychiatric/Behavioral: Negative.   All other systems reviewed and are negative.      Objective:   Physical Exam  Constitutional: She is oriented to person, place, and time.  HENT:  Head: Normocephalic.  Nose: Nose normal.  Mouth/Throat: Oropharynx is clear and moist.  Eyes: Pupils are equal, round, and reactive to light. EOM are normal.  Neck: Normal range of motion. Neck supple. No JVD present. Carotid bruit is not present.  Cardiovascular: Normal rate, regular rhythm, normal heart sounds and intact distal pulses.  Pulmonary/Chest: Effort normal and breath sounds normal. No  respiratory distress. She has no wheezes. She has no rales. She exhibits no tenderness.  Abdominal: Soft. Normal appearance, normal aorta and bowel sounds are normal. She exhibits no distension, no abdominal bruit, no pulsatile midline mass and no mass. There is no splenomegaly or hepatomegaly. There is no tenderness.  Musculoskeletal: Normal range of motion. She exhibits no edema.  Lymphadenopathy:    She has no cervical adenopathy.  Neurological: She is alert and oriented to person, place, and time. She has normal reflexes.  Skin: Skin is warm and dry.  Psychiatric: She has a normal mood and affect. Her behavior is normal. Judgment and thought content normal.   BP 109/67   Pulse 66   Temp (!) 97.3 F (36.3 C) (Oral)   Ht 5\' 2"  (1.575 m)   Wt 199 lb (90.3 kg)   BMI 36.40 kg/m        Assessment & Plan:  Shelley Whitehead in today with chief complaint of Recheck ADHD   1. Attention deficit disorder (ADD) without hyperactivity Stress management - lisdexamfetamine (VYVANSE) 40 MG capsule; Take 1 capsule (40 mg total) by mouth every morning.  Dispense: 30 capsule; Refill: 0 - lisdexamfetamine (VYVANSE) 40 MG capsule; Take 1 capsule (40 mg total) by mouth every morning.  Dispense: 30 capsule; Refill: 0 - lisdexamfetamine (VYVANSE) 40 MG capsule; Take 1 capsule (40 mg total) by mouth every morning.  Dispense: 30 capsule; Refill: 0  2. Severe episode of recurrent major depressive  disorder, without psychotic features (HCC) Let nme know if depression returns  Mary-Margaret Daphine DeutscherMartin, FNP

## 2018-05-19 NOTE — Patient Instructions (Signed)
Stress and Stress Management Stress is a normal reaction to life events. It is what you feel when life demands more than you are used to or more than you can handle. Some stress can be useful. For example, the stress reaction can help you catch the last bus of the day, study for a test, or meet a deadline at work. But stress that occurs too often or for too long can cause problems. It can affect your emotional health and interfere with relationships and normal daily activities. Too much stress can weaken your immune system and increase your risk for physical illness. If you already have a medical problem, stress can make it worse. What are the causes? All sorts of life events may cause stress. An event that causes stress for one person may not be stressful for another person. Major life events commonly cause stress. These may be positive or negative. Examples include losing your job, moving into a new home, getting married, having a baby, or losing a loved one. Less obvious life events may also cause stress, especially if they occur day after day or in combination. Examples include working long hours, driving in traffic, caring for children, being in debt, or being in a difficult relationship. What are the signs or symptoms? Stress may cause emotional symptoms including, the following:  Anxiety. This is feeling worried, afraid, on edge, overwhelmed, or out of control.  Anger. This is feeling irritated or impatient.  Depression. This is feeling sad, down, helpless, or guilty.  Difficulty focusing, remembering, or making decisions.  Stress may cause physical symptoms, including the following:  Aches and pains. These may affect your head, neck, back, stomach, or other areas of your body.  Tight muscles or clenched jaw.  Low energy or trouble sleeping.  Stress may cause unhealthy behaviors, including the following:  Eating to feel better (overeating) or skipping meals.  Sleeping too little,  too much, or both.  Working too much or putting off tasks (procrastination).  Smoking, drinking alcohol, or using drugs to feel better.  How is this diagnosed? Stress is diagnosed through an assessment by your health care provider. Your health care provider will ask questions about your symptoms and any stressful life events.Your health care provider will also ask about your medical history and may order blood tests or other tests. Certain medical conditions and medicine can cause physical symptoms similar to stress. Mental illness can cause emotional symptoms and unhealthy behaviors similar to stress. Your health care provider may refer you to a mental health professional for further evaluation. How is this treated? Stress management is the recommended treatment for stress.The goals of stress management are reducing stressful life events and coping with stress in healthy ways. Techniques for reducing stressful life events include the following:  Stress identification. Self-monitor for stress and identify what causes stress for you. These skills may help you to avoid some stressful events.  Time management. Set your priorities, keep a calendar of events, and learn to say "no." These tools can help you avoid making too many commitments.  Techniques for coping with stress include the following:  Rethinking the problem. Try to think realistically about stressful events rather than ignoring them or overreacting. Try to find the positives in a stressful situation rather than focusing on the negatives.  Exercise. Physical exercise can release both physical and emotional tension. The key is to find a form of exercise you enjoy and do it regularly.  Relaxation techniques. These relax the body and  mind. Examples include yoga, meditation, tai chi, biofeedback, deep breathing, progressive muscle relaxation, listening to music, being out in nature, journaling, and other hobbies. Again, the key is to find  one or more that you enjoy and can do regularly.  Healthy lifestyle. Eat a balanced diet, get plenty of sleep, and do not smoke. Avoid using alcohol or drugs to relax.  Strong support network. Spend time with family, friends, or other people you enjoy being around.Express your feelings and talk things over with someone you trust.  Counseling or talktherapy with a mental health professional may be helpful if you are having difficulty managing stress on your own. Medicine is typically not recommended for the treatment of stress.Talk to your health care provider if you think you need medicine for symptoms of stress. Follow these instructions at home:  Keep all follow-up visits as directed by your health care provider.  Take all medicines as directed by your health care provider. Contact a health care provider if:  Your symptoms get worse or you start having new symptoms.  You feel overwhelmed by your problems and can no longer manage them on your own. Get help right away if:  You feel like hurting yourself or someone else. This information is not intended to replace advice given to you by your health care provider. Make sure you discuss any questions you have with your health care provider. Document Released: 04/06/2001 Document Revised: 03/18/2016 Document Reviewed: 06/05/2013 Elsevier Interactive Patient Education  2017 [employer]  

## 2018-07-14 ENCOUNTER — Other Ambulatory Visit: Payer: Self-pay | Admitting: Nurse Practitioner

## 2018-07-14 DIAGNOSIS — N920 Excessive and frequent menstruation with regular cycle: Secondary | ICD-10-CM

## 2018-07-14 NOTE — Telephone Encounter (Signed)
Last PAP 05/03/16

## 2018-07-14 NOTE — Telephone Encounter (Signed)
Needs to make appointment for pap

## 2018-07-27 ENCOUNTER — Ambulatory Visit: Payer: BC Managed Care – PPO | Admitting: Nurse Practitioner

## 2018-07-27 ENCOUNTER — Encounter: Payer: Self-pay | Admitting: Nurse Practitioner

## 2018-07-27 VITALS — BP 114/73 | HR 73 | Temp 98.5°F | Ht 62.0 in | Wt 202.0 lb

## 2018-07-27 DIAGNOSIS — F411 Generalized anxiety disorder: Secondary | ICD-10-CM

## 2018-07-27 MED ORDER — ALPRAZOLAM 0.25 MG PO TABS: 0.2500 mg | ORAL_TABLET | Freq: Two times a day (BID) | ORAL | 0 refills | Status: DC | PRN

## 2018-07-27 NOTE — Patient Instructions (Signed)
Coping With Loss, Adult People experience loss in many different ways throughout their lives. Events such as moving, changing jobs, and losing friends can create a sense of loss. The loss may be as serious as a major health change, divorce, death of a pet, or death of a loved one. All of these types of loss are likely to create a physical and emotional reaction known as grief. Grief is the result of a major change or an absence of something or someone that you count on. Grief is a normal reaction to loss. How to recognize changes A variety of factors can affect your grieving experience, including:  The nature of your loss.  Your relationship to what or whom you lost.  Your understanding of grief and how to cope with it.  Your support system.  The way that you deal with your grief will affect your ability to function as you normally do. When you are grieving, you may experience:  Numbness, shock, sadness, anxiety, anger, denial, and guilt.  Thoughts about death.  Unexpected crying.  A physical sensation of emptiness in your gut.  Problems sleeping and eating.  Fatigue.  Loss of interest in normal activities.  Dreaming about or imagining seeing the person who died.  A need to remember what or whom you lost.  Difficulty thinking about anything other than your loss for a period of time.  Relief. If you have been expecting the loss for a while, you may feel a sense of relief when it happens.  Where to find support To get support for coping with loss:  Ask your health care provider for help and recommendations, such as grief counseling or therapy.  Think about joining a support group for people who are coping with loss.  Follow these instructions at home:  Be patient with yourself and others. Allow the grieving process to happen, and remember that grieving takes time. ? It is likely that you may never feel completely done with some grief. You may find a way to move on while  still cherishing memories and feelings about your loss. ? Accepting your loss is a process. It can take months or longer to adjust.  Express your feelings in healthy ways, such as: ? Talking with others about your loss. It may be helpful to find others who have had a similar loss, such as a support group. ? Writing down your feelings in a journal. ? Doing physical activities to release stress and emotional energy. ? Doing creative activities like painting, sculpting, or playing or listening to music. ? Practicing resilience. This is the ability to recover and adjust after facing challenges. Reading some resources that encourage resilience may help you to learn ways to practice those behaviors.  Keep to your normal routine as much as possible. If you have trouble focusing or doing normal activities, it is acceptable to take some time away from your normal routine.  Spend time with friends and loved ones.  Eat a healthy diet, get plenty of sleep, and rest when you feel tired. Where to find more information: You can find more information about coping with loss from:  American Society of Clinical Oncology: www.cancer.net  American Psychological Association: www.apa.org  Contact a health care provider if:  Your grief is extreme and keeps getting worse.  You have ongoing grief that does not improve.  Your body shows symptoms of grief, such as illness.  You feel depressed, anxious, or lonely. Get help right away if:  You have   thoughts about hurting yourself or others. If you ever feel like you may hurt yourself or others, or have thoughts about taking your own life, get help right away. You can go to your nearest emergency department or call:  Your local emergency services (911 in the [place]  A suicide crisis helpline, such as the National Suicide Prevention Lifeline at 1-800-273-8255. This is open 24 hours a day.  Summary  Grief is a normal part of experiencing a loss. It is the  result of a major change or an absence of something or someone that you count on.  The depth of grief and the period of recovery depend on the type of loss as well as your ability to adjust to the change and process your feelings.  Processing grief requires patience and a willingness to accept your feelings and talk about your loss with people who are supportive.  It is important to find resources that work for you and to realize that we are all different when it comes to grief. There is not one single grieving process that works for everyone in the same way.  Be aware that when grief becomes extreme, it can lead to more severe issues like isolation, depression, anxiety, or suicidal thoughts. Talk with your health care provider if you have any of these issues. This information is not intended to replace advice given to you by your health care provider. Make sure you discuss any questions you have with your health care provider. Document Released: 02/24/2017 Document Revised: 02/24/2017 Document Reviewed: 02/24/2017 Elsevier Interactive Patient Education  2018 Elsevier Inc.  

## 2018-07-27 NOTE — Progress Notes (Signed)
   Subjective:    Patient ID: Shelley Whitehead, female    DOB: [birth year], 27 y.o.   MRN: [id number]   Chief Complaint: Mom passed away unexpectedly (having a difficult time)   HPI Patien tcome sin today c/o anxiety. Her mother died unexpectantly last 03-18-23. She wa an only child and really doe snot have a strong support system. She is in a new relationship and her new boyfriend has been supportive. This has helped.    Review of Systems  Constitutional: Negative for activity change and appetite change.  HENT: Negative.   Eyes: Negative for pain.  Respiratory: Negative for shortness of breath.   Cardiovascular: Negative for chest pain, palpitations and leg swelling.  Gastrointestinal: Negative for abdominal pain.  Endocrine: Negative for polydipsia.  Genitourinary: Negative.   Skin: Negative for rash.  Neurological: Negative for dizziness, weakness and headaches.  Hematological: Does not bruise/bleed easily.  Psychiatric/Behavioral: The patient is nervous/anxious.   All other systems reviewed and are negative.      Objective:   Physical Exam  Constitutional: She is oriented to person, place, and time. She appears well-developed and well-nourished. No distress.  HENT:  Head: Normocephalic.  Nose: Nose normal.  Mouth/Throat: Oropharynx is clear and moist.  Eyes: Pupils are equal, round, and reactive to light. EOM are normal.  Neck: Normal range of motion. Neck supple. No JVD present. Carotid bruit is not present.  Cardiovascular: Normal rate, regular rhythm, normal heart sounds and intact distal pulses.  Pulmonary/Chest: Effort normal and breath sounds normal. No respiratory distress. She has no wheezes. She has no rales. She exhibits no tenderness.  Abdominal: Soft. Normal appearance, normal aorta and bowel sounds are normal. She exhibits no distension, no abdominal bruit, no pulsatile midline mass and no mass. There is no splenomegaly or hepatomegaly. There is no tenderness.    Musculoskeletal: Normal range of motion. She exhibits no edema.  Lymphadenopathy:    She has no cervical adenopathy.  Neurological: She is alert and oriented to person, place, and time. She has normal reflexes.  Skin: Skin is warm and dry.  Psychiatric: Her behavior is normal. Judgment and thought content normal. Her mood appears anxious.  Tearful during exam  Nursing note and vitals reviewed.  BP 114/73   Pulse 73   Temp 98.5 F (36.9 C) (Oral)   Ht 5\' 2"  (1.575 m)   Wt 202 lb (91.6 kg)   BMI 36.95 kg/m       Assessment & Plan:  Shelley Whitehead in today with chief complaint of Mom passed away unexpectedly (having a difficult time)   1. GAD (generalized anxiety disorder) Grief stages - ALPRAZolam (XANAX) 0.25 MG tablet; Take 1 tablet (0.25 mg total) by mouth 2 (two) times daily as needed for anxiety.  Dispense: 40 tablet; Refill: 0  Mary-Margaret Daphine Deutscher, FNP

## 2018-09-27 ENCOUNTER — Encounter: Payer: Self-pay | Admitting: Nurse Practitioner

## 2018-09-27 ENCOUNTER — Ambulatory Visit: Payer: BC Managed Care – PPO | Admitting: Nurse Practitioner

## 2018-09-27 VITALS — BP 119/76 | HR 84 | Temp 98.7°F | Ht 62.0 in | Wt 211.0 lb

## 2018-09-27 DIAGNOSIS — F332 Major depressive disorder, recurrent severe without psychotic features: Secondary | ICD-10-CM | POA: Diagnosis not present

## 2018-09-27 DIAGNOSIS — F988 Other specified behavioral and emotional disorders with onset usually occurring in childhood and adolescence: Secondary | ICD-10-CM

## 2018-09-27 MED ORDER — LISDEXAMFETAMINE DIMESYLATE 40 MG PO CAPS: 40.0000 mg | ORAL_CAPSULE | ORAL | 0 refills | Status: DC

## 2018-09-27 MED ORDER — ALPRAZOLAM 0.25 MG PO TABS: 0.2500 mg | ORAL_TABLET | Freq: Two times a day (BID) | ORAL | 0 refills | Status: DC | PRN

## 2018-09-27 NOTE — Patient Instructions (Signed)
Stress and Stress Management Stress is a normal reaction to life events. It is what you feel when life demands more than you are used to or more than you can handle. Some stress can be useful. For example, the stress reaction can help you catch the last bus of the day, study for a test, or meet a deadline at work. But stress that occurs too often or for too long can cause problems. It can affect your emotional health and interfere with relationships and normal daily activities. Too much stress can weaken your immune system and increase your risk for physical illness. If you already have a medical problem, stress can make it worse. What are the causes? All sorts of life events may cause stress. An event that causes stress for one person may not be stressful for another person. Major life events commonly cause stress. These may be positive or negative. Examples include losing your job, moving into a new home, getting married, having a baby, or losing a loved one. Less obvious life events may also cause stress, especially if they occur day after day or in combination. Examples include working long hours, driving in traffic, caring for children, being in debt, or being in a difficult relationship. What are the signs or symptoms? Stress may cause emotional symptoms including, the following:  Anxiety. This is feeling worried, afraid, on edge, overwhelmed, or out of control.  Anger. This is feeling irritated or impatient.  Depression. This is feeling sad, down, helpless, or guilty.  Difficulty focusing, remembering, or making decisions.  Stress may cause physical symptoms, including the following:  Aches and pains. These may affect your head, neck, back, stomach, or other areas of your body.  Tight muscles or clenched jaw.  Low energy or trouble sleeping.  Stress may cause unhealthy behaviors, including the following:  Eating to feel better (overeating) or skipping meals.  Sleeping too little,  too much, or both.  Working too much or putting off tasks (procrastination).  Smoking, drinking alcohol, or using drugs to feel better.  How is this diagnosed? Stress is diagnosed through an assessment by your health care provider. Your health care provider will ask questions about your symptoms and any stressful life events.Your health care provider will also ask about your medical history and may order blood tests or other tests. Certain medical conditions and medicine can cause physical symptoms similar to stress. Mental illness can cause emotional symptoms and unhealthy behaviors similar to stress. Your health care provider may refer you to a mental health professional for further evaluation. How is this treated? Stress management is the recommended treatment for stress.The goals of stress management are reducing stressful life events and coping with stress in healthy ways. Techniques for reducing stressful life events include the following:  Stress identification. Self-monitor for stress and identify what causes stress for you. These skills may help you to avoid some stressful events.  Time management. Set your priorities, keep a calendar of events, and learn to say "no." These tools can help you avoid making too many commitments.  Techniques for coping with stress include the following:  Rethinking the problem. Try to think realistically about stressful events rather than ignoring them or overreacting. Try to find the positives in a stressful situation rather than focusing on the negatives.  Exercise. Physical exercise can release both physical and emotional tension. The key is to find a form of exercise you enjoy and do it regularly.  Relaxation techniques. These relax the body and  mind. Examples include yoga, meditation, tai chi, biofeedback, deep breathing, progressive muscle relaxation, listening to music, being out in nature, journaling, and other hobbies. Again, the key is to find  one or more that you enjoy and can do regularly.  Healthy lifestyle. Eat a balanced diet, get plenty of sleep, and do not smoke. Avoid using alcohol or drugs to relax.  Strong support network. Spend time with family, friends, or other people you enjoy being around.Express your feelings and talk things over with someone you trust.  Counseling or talktherapy with a mental health professional may be helpful if you are having difficulty managing stress on your own. Medicine is typically not recommended for the treatment of stress.Talk to your health care provider if you think you need medicine for symptoms of stress. Follow these instructions at home:  Keep all follow-up visits as directed by your health care provider.  Take all medicines as directed by your health care provider. Contact a health care provider if:  Your symptoms get worse or you start having new symptoms.  You feel overwhelmed by your problems and can no longer manage them on your own. Get help right away if:  You feel like hurting yourself or someone else. This information is not intended to replace advice given to you by your health care provider. Make sure you discuss any questions you have with your health care provider. Document Released: 04/06/2001 Document Revised: 03/18/2016 Document Reviewed: 06/05/2013 Elsevier Interactive Patient Education  2017 [employer]  

## 2018-09-27 NOTE — Progress Notes (Signed)
Subjective:    Patient ID: Shelley Whitehead, female    DOB: [birth year], 27 y.o.   MRN: [id number]   Chief Complaint: medical management of chronic issues  HPI:  1. Attention deficit disorder (ADD) without hyperactivity  Patient has been on vyvanse 40mg  daily for many years. She has had troble concentrating as far back as she can remember. 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 She denies any medication side effects.   2. Severe episode of recurrent major depressive disorder, without psychotic features (HCC) Shelley Whitehead has had a rough year. She had a 2 year relationship end badly as well as her mom died suddenly from a heart attack. This caused [place] to place her back on  Antidepressant for a short period of time. She is doing much better now and is on no meds. She has some xanax on hand as needed but she does not like to take it,    Outpatient Encounter Medications as of 09/27/2018  Medication Sig  . ALPRAZolam (XANAX) 0.25 MG tablet Take 1 tablet (0.25 mg total) by mouth 2 (two) times daily as needed for anxiety.  Colleen Can FE 1.5/30 1.5-30 MG-MCG tablet TAKE 1 TABLET BY MOUTH EVERY DAY  . lisdexamfetamine (VYVANSE) 40 MG capsule Take 1 capsule (40 mg total) by mouth every morning.  . lisdexamfetamine (VYVANSE) 40 MG capsule Take 1 capsule (40 mg total) by mouth every morning.  . lisdexamfetamine (VYVANSE) 40 MG capsule Take 1 capsule (40 mg total) by mouth every morning.  . medroxyPROGESTERone (PROVERA) 10 MG tablet Take 1 tablet (10 mg total) by mouth daily.     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 Is in a new relationship that she says is going well.    Review of Systems  Constitutional: Negative for activity change and appetite change.  HENT: Negative.   Eyes: Negative for pain.  Respiratory: Negative for shortness of breath.   Cardiovascular: Negative for chest pain, palpitations and leg swelling.    Gastrointestinal: Negative for abdominal pain.  Endocrine: Negative for polydipsia.  Genitourinary: Negative.   Skin: Negative for rash.  Neurological: Negative for dizziness, weakness and headaches.  Hematological: Does not bruise/bleed easily.  Psychiatric/Behavioral: Negative.   All other systems reviewed and are negative.      Objective:   Physical Exam  Constitutional: She is oriented to person, place, and time. She appears well-developed and well-nourished. No distress.  HENT:  Head: Normocephalic.  Nose: Nose normal.  Mouth/Throat: Oropharynx is clear and moist.  Eyes: Pupils are equal, round, and reactive to light. EOM are normal.  Neck: Normal range of motion. Neck supple. No JVD present. Carotid bruit is not present.  Cardiovascular: Normal rate, regular rhythm, normal heart sounds and intact distal pulses.  Pulmonary/Chest: Effort normal and breath sounds normal. No respiratory distress. She has no wheezes. She has no rales. She exhibits no tenderness.  Abdominal: Soft. Normal appearance, normal aorta and bowel sounds are normal. She exhibits no distension, no abdominal bruit, no pulsatile midline mass and no mass. There is no splenomegaly or hepatomegaly. There is no tenderness.  Musculoskeletal: Normal range of motion. She exhibits no edema.  Lymphadenopathy:    She has no cervical adenopathy.  Neurological: She is alert and oriented to person, place, and time. She has normal reflexes.  Skin: Skin is warm and dry.  Psychiatric: She has a normal mood and affect. Her behavior is normal. Judgment  and thought content normal.  Nursing note and vitals reviewed.   BP 119/76   Pulse 84   Temp 98.7 F (37.1 C) (Oral)   Ht 5\' 2"  (1.575 m)   Wt 211 lb (95.7 kg)   BMI 38.59 kg/m        Assessment & Plan:  Shelley FilterMegan Angelini in today with chief complaint of No chief complaint on file.   1. Attention deficit disorder (ADD) without hyperactivity Behavior modification -  lisdexamfetamine (VYVANSE) 40 MG capsule; Take 1 capsule (40 mg total) by mouth every morning.  Dispense: 30 capsule; Refill: 0 - lisdexamfetamine (VYVANSE) 40 MG capsule; Take 1 capsule (40 mg total) by mouth every morning.  Dispense: 30 capsule; Refill: 0 - lisdexamfetamine (VYVANSE) 40 MG capsule; Take 1 capsule (40 mg total) by mouth every morning.  Dispense: 30 capsule; Refill: 0  2. Severe episode of recurrent major depressive disorder, without psychotic features (HCC) Stress management - ALPRAZolam (XANAX) 0.25 MG tablet; Take 1 tablet (0.25 mg total) by mouth 2 (two) times daily as needed for anxiety.  Dispense: 40 tablet; Refill: 0  Shelley Daphine DeutscherMartin, FNP

## 2018-10-08 ENCOUNTER — Other Ambulatory Visit: Payer: Self-pay | Admitting: Nurse Practitioner

## 2018-10-08 DIAGNOSIS — N920 Excessive and frequent menstruation with regular cycle: Secondary | ICD-10-CM

## 2018-10-09 MED ORDER — NORETHIN ACE-ETH ESTRAD-FE 1.5-30 MG-MCG PO TABS: 1.0000 | ORAL_TABLET | Freq: Every day | ORAL | 3 refills | Status: DC

## 2018-10-09 NOTE — Telephone Encounter (Signed)
Pt was seen 09/27/18 so rx for junel sent to pharmacy.

## 2018-10-09 NOTE — Telephone Encounter (Signed)
MMM. NTBS per MMM's RF 07/14/18

## 2018-10-09 NOTE — Addendum Note (Signed)
Addended by: Margurite AuerbachOMPTON, KARLA G on: 10/09/2018 11:04 AM   Modules accepted: Orders

## 2018-11-06 ENCOUNTER — Encounter: Payer: Self-pay | Admitting: Obstetrics and Gynecology

## 2018-11-23 ENCOUNTER — Encounter: Payer: Self-pay | Admitting: Obstetrics and Gynecology

## 2018-11-23 ENCOUNTER — Other Ambulatory Visit: Payer: Self-pay

## 2018-11-23 ENCOUNTER — Ambulatory Visit: Payer: BC Managed Care – PPO | Admitting: Obstetrics and Gynecology

## 2018-11-23 ENCOUNTER — Other Ambulatory Visit (HOSPITAL_COMMUNITY)
Admission: RE | Admit: 2018-11-23 | Discharge: 2018-11-23 | Disposition: A | Discharge status: Home / Self Care | Payer: BC Managed Care – PPO | Source: Ambulatory Visit | Attending: Obstetrics and Gynecology | Admitting: Obstetrics and Gynecology

## 2018-11-23 VITALS — BP 112/80 | HR 72 | Ht 61.5 in | Wt 215.0 lb

## 2018-11-23 DIAGNOSIS — Z124 Encounter for screening for malignant neoplasm of cervix: Secondary | ICD-10-CM

## 2018-11-23 DIAGNOSIS — Z3041 Encounter for surveillance of contraceptive pills: Secondary | ICD-10-CM

## 2018-11-23 DIAGNOSIS — Z0000 Encounter for general adult medical examination without abnormal findings: Secondary | ICD-10-CM

## 2018-11-23 DIAGNOSIS — Z23 Encounter for immunization: Secondary | ICD-10-CM

## 2018-11-23 DIAGNOSIS — Z113 Encounter for screening for infections with a predominantly sexual mode of transmission: Secondary | ICD-10-CM

## 2018-11-23 DIAGNOSIS — Z6841 Body Mass Index (BMI) 40.0 and over, adult: Secondary | ICD-10-CM | POA: Diagnosis not present

## 2018-11-23 DIAGNOSIS — Z833 Family history of diabetes mellitus: Secondary | ICD-10-CM

## 2018-11-23 DIAGNOSIS — Z01419 Encounter for gynecological examination (general) (routine) without abnormal findings: Secondary | ICD-10-CM | POA: Diagnosis not present

## 2018-11-23 NOTE — Patient Instructions (Signed)
EXERCISE AND DIET:  We recommended that you start or continue a regular exercise program for good health. Regular exercise means any activity that makes your heart beat faster and makes you sweat.  We recommend exercising at least 30 minutes per day at least 3 days a week, preferably 4 or 5.  We also recommend a diet low in fat and sugar.  Inactivity, poor dietary choices and obesity can cause diabetes, heart attack, stroke, and kidney damage, among others.    ALCOHOL AND SMOKING:  Women should limit their alcohol intake to no more than 7 drinks/beers/glasses of wine (combined, not each!) per week. Moderation of alcohol intake to this level decreases your risk of breast cancer and liver damage. And of course, no recreational drugs are part of a healthy lifestyle.  And absolutely no smoking or even second hand smoke. Most people know smoking can cause heart and lung diseases, but did you know it also contributes to weakening of your bones? Aging of your skin?  Yellowing of your teeth and nails?  CALCIUM AND VITAMIN D:  Adequate intake of calcium and Vitamin D are recommended.  The recommendations for exact amounts of these supplements seem to change often, but generally speaking 1,000 mg of calcium (between diet and supplement) and 800 units of Vitamin D per day seems prudent. Certain women may benefit from higher intake of Vitamin D.  If you are among these women, your doctor will have told you during your visit.    PAP SMEARS:  Pap smears, to check for cervical cancer or precancers,  have traditionally been done yearly, although recent scientific advances have shown that most women can have pap smears less often.  However, every woman still should have a physical exam from her gynecologist every year. It will include a breast check, inspection of the vulva and vagina to check for abnormal growths or skin changes, a visual exam of the cervix, and then an exam to evaluate the size and shape of the uterus and  ovaries.  And after 28 years of age, a rectal exam is indicated to check for rectal cancers. We will also provide age appropriate advice regarding health maintenance, like when you should have certain vaccines, screening for sexually transmitted diseases, bone density testing, colonoscopy, mammograms, etc.   MAMMOGRAMS:  All women over 40 years old should have a yearly mammogram. Many facilities now offer a "3D" mammogram, which may cost around $50 extra out of pocket. If possible,  we recommend you accept the option to have the 3D mammogram performed.  It both reduces the number of women who will be called back for extra views which then turn out to be normal, and it is better than the routine mammogram at detecting truly abnormal areas.    COLON CANCER SCREENING: Now recommend starting at age 45. At this time colonoscopy is not covered for routine screening until 50. There are take home tests that can be done between 45-49.   COLONOSCOPY:  Colonoscopy to screen for colon cancer is recommended for all women at age 50.  We know, you hate the idea of the prep.  We agree, BUT, having colon cancer and not knowing it is worse!!  Colon cancer so often starts as a polyp that can be seen and removed at colonscopy, which can quite literally save your life!  And if your first colonoscopy is normal and you have no family history of colon cancer, most women don't have to have it again for   10 years.  Once every ten years, you can do something that may end up saving your life, right?  We will be happy to help you get it scheduled when you are ready.  Be sure to check your insurance coverage so you understand how much it will cost.  It may be covered as a preventative service at no cost, but you should check your particular policy.      Breast Self-Awareness Breast self-awareness means being familiar with how your breasts look and feel. It involves checking your breasts regularly and reporting any changes to your  health care provider. Practicing breast self-awareness is important. A change in your breasts can be a sign of a serious medical problem. Being familiar with how your breasts look and feel allows you to find any problems early, when treatment is more likely to be successful. All women should practice breast self-awareness, including women who have had breast implants. How to do a breast self-exam One way to learn what is normal for your breasts and whether your breasts are changing is to do a breast self-exam. To do a breast self-exam: Look for Changes  1. Remove all the clothing above your waist. 2. Stand in front of a mirror in a room with good lighting. 3. Put your hands on your hips. 4. Push your hands firmly downward. 5. Compare your breasts in the mirror. Look for differences between them (asymmetry), such as: ? Differences in shape. ? Differences in size. ? Puckers, dips, and bumps in one breast and not the other. 6. Look at each breast for changes in your skin, such as: ? Redness. ? Scaly areas. 7. Look for changes in your nipples, such as: ? Discharge. ? Bleeding. ? Dimpling. ? Redness. ? A change in position. Feel for Changes Carefully feel your breasts for lumps and changes. It is best to do this while lying on your back on the floor and again while sitting or standing in the shower or tub with soapy water on your skin. Feel each breast in the following way:  Place the arm on the side of the breast you are examining above your head.  Feel your breast with the other hand.  Start in the nipple area and make  inch (2 cm) overlapping circles to feel your breast. Use the pads of your three middle fingers to do this. Apply light pressure, then medium pressure, then firm pressure. The light pressure will allow you to feel the tissue closest to the skin. The medium pressure will allow you to feel the tissue that is a little deeper. The firm pressure will allow you to feel the tissue  close to the ribs.  Continue the overlapping circles, moving downward over the breast until you feel your ribs below your breast.  Move one finger-width toward the center of the body. Continue to use the  inch (2 cm) overlapping circles to feel your breast as you move slowly up toward your collarbone.  Continue the up and down exam using all three pressures until you reach your armpit.  Write Down What You Find  Write down what is normal for each breast and any changes that you find. Keep a written record with breast changes or normal findings for each breast. By writing this information down, you do not need to depend only on memory for size, tenderness, or location. Write down where you are in your menstrual cycle, if you are still menstruating. If you are having trouble noticing differences   in your breasts, do not get discouraged. With time you will become more familiar with the variations in your breasts and more comfortable with the exam. How often should I examine my breasts? Examine your breasts every month. If you are breastfeeding, the best time to examine your breasts is after a feeding or after using a breast pump. If you menstruate, the best time to examine your breasts is 5-7 days after your period is over. During your period, your breasts are lumpier, and it may be more difficult to notice changes. When should I see my health care provider? See your health care provider if you notice:  A change in shape or size of your breasts or nipples.  A change in the skin of your breast or nipples, such as a reddened or scaly area.  Unusual discharge from your nipples.  A lump or thick area that was not there before.  Pain in your breasts.  Anything that concerns you.  

## 2018-11-23 NOTE — Progress Notes (Signed)
28 y.o. G0P0000 Single White or Caucasian Not Hispanic or Latino female here for annual exam.   She c/o a random sharp pain in her vagina, occurs ~1-2 x a month. Just in the last 6 months. Not at any particular time in her cycle. She takes her pills cyclically, she occasionally skips a cycle. When she gets a cycle it is light to moderate.  Sexually active, same partner x ~1 year. Recently she has had intermittent deep dyspareunia, positional. Symptoms started in the last month. She has had some issues with constipation during the last month. Typically BM's are 1-2 x a day.  She gets a lot of UTI's. Hasn't been seen or treated, go away on there own.   Period Duration (Days): 4 days Period Pattern: (!) Irregular(due to birth control pills) Menstrual Flow: Moderate Menstrual Control: Tampon, Thin pad Menstrual Control Change Freq (Hours): changes tampon/pad every 6 hours Dysmenorrhea: (!) Moderate Dysmenorrhea Symptoms: Cramping  Patient's last menstrual period was 11/23/2018 (exact date).          Sexually active: Yes.    The current method of family planning is OCP (estrogen/progesterone).    Exercising: No.  The patient does not participate in regular exercise at present. Smoker:  no  Health Maintenance: Pap:  2015 History of abnormal Pap:  no TDaP:  Unsure, thinks in 2010 Gardasil: completed all 3    reports that she has never smoked. She has never used smokeless tobacco. She reports current alcohol use of about 2.0 - 3.0 standard drinks of alcohol per week. She reports that she does not use drugs. 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  She may move near [place] to be with boyfriend.   Past Medical History:  Diagnosis Date  . ADD (attention deficit disorder)   . Anxiety   . Dysmenorrhea     Past Surgical History:  Procedure Laterality Date  . polyp removed as child      Current Outpatient Medications  Medication Sig Dispense Refill  . ALPRAZolam  (XANAX) 0.25 MG tablet Take 1 tablet (0.25 mg total) by mouth 2 (two) times daily as needed for anxiety. 40 tablet 0  . [START ON 11/26/2018] lisdexamfetamine (VYVANSE) 40 MG capsule Take 1 capsule (40 mg total) by mouth every morning. 30 capsule 0  . lisdexamfetamine (VYVANSE) 40 MG capsule Take 1 capsule (40 mg total) by mouth every morning. 30 capsule 0  . norethindrone-ethinyl estradiol-iron (JUNEL FE 1.5/30) 1.5-30 MG-MCG tablet Take 1 tablet by mouth daily. 84 tablet 3  . lisdexamfetamine (VYVANSE) 40 MG capsule Take 1 capsule (40 mg total) by mouth every morning. 30 capsule 0   No current facility-administered medications for this visit.     Family History  Problem Relation Age of Onset  . Diabetes Mother   . Heart disease Mother   . Cervical cancer Mother   . Breast cancer Paternal Grandmother   Her Mom died in 07/24/23 at 73, was told she died of heart disease, no autopsy. She hadn't been to the MD for 10 years, no insurance. Died suddenly.   The patient is an only child, she was very close with her Mom. She is also close with her MGM.   Review of Systems  Constitutional: Negative.   HENT: Negative.   Eyes: Negative.   Respiratory: Negative.   Cardiovascular: Negative.   Gastrointestinal: Negative.   Endocrine: Negative.   Genitourinary: Positive for pelvic pain and vaginal pain.  Musculoskeletal: Negative.   Skin: Negative.   Allergic/Immunologic: Negative.   Neurological: Negative.   Hematological: Negative.   Psychiatric/Behavioral: Negative.   She has gained 20 lbs since her Mom died in the fall.  She does feel depressed since her mom died, is seeing a therapist. Would like to avoid medication. Gained weight on Celexa in the past.   Exam:   BP 112/80 (BP Location: Right Arm, Patient Position: Sitting, Cuff Size: Normal)   Pulse 72   Ht 5' 1.5" (1.562 m)   Wt 215 lb (97.5 kg)   LMP 11/23/2018 (Exact Date)   BMI 39.97 kg/m   Weight change: @WEIGHTCHANGE @ Height:    Height: 5' 1.5" (156.2 cm)  Ht Readings from Last 3 Encounters:  11/23/18 5' 1.5" (1.562 m)  09/27/18 5\' 2"  (1.575 m)  07/27/18 5\' 2"  (1.575 m)    General appearance: alert, cooperative and appears stated age Head: Normocephalic, without obvious abnormality, atraumatic Neck: no adenopathy, supple, symmetrical, trachea midline and thyroid normal to inspection and palpation Lungs: clear to auscultation bilaterally Cardiovascular: regular rate and rhythm Breasts: normal appearance, no masses or tenderness Abdomen: soft, non-tender; non distended,  no masses,  no organomegaly Extremities: extremities normal, atraumatic, no cyanosis or edema Skin: Skin color, texture, turgor normal. No rashes or lesions Lymph nodes: Cervical, supraclavicular, and axillary nodes normal. No abnormal inguinal nodes palpated Neurologic: Grossly normal   Pelvic: External genitalia:  no lesions              Urethra:  normal appearing urethra with no masses, tenderness or lesions              Bartholins and Skenes: normal                 Vagina: normal appearing vagina with normal color and discharge, no lesions              Cervix: no lesions               Bimanual Exam:  Uterus:  normal size, contour, position, consistency, mobility, non-tender and anteverted              Adnexa: no mass, fullness, tenderness               Rectovaginal: Confirms               Anus:  normal sphincter tone, no lesions  Chaperone was present for exam.  A:  Well Woman with normal exam  Doing well on OCP's  BMI 40  FH of DM  P:   Screening labs, HgbA1C, TSH, std testing  Pap with reflex hpv, GC/CT/Trich  Continue OCP's  TDAP  Discussed breast self exam  Discussed calcium and vit D intake  Discussed weight watchers and exercise

## 2018-11-24 LAB — TSH: TSH: 7.95 u[IU]/mL — ABNORMAL HIGH (ref 0.450–4.500)

## 2018-11-24 LAB — COMPREHENSIVE METABOLIC PANEL
ALT: 11 IU/L (ref 0–32)
AST: 19 IU/L (ref 0–40)
Albumin/Globulin Ratio: 1.3 (ref 1.2–2.2)
Albumin: 4.2 g/dL (ref 3.9–5.0)
Alkaline Phosphatase: 70 IU/L (ref 39–117)
BUN/Creatinine Ratio: 15 (ref 9–23)
BUN: 11 mg/dL (ref 6–20)
Bilirubin Total: 0.2 mg/dL (ref 0.0–1.2)
CO2: 21 mmol/L (ref 20–29)
Calcium: 9.2 mg/dL (ref 8.7–10.2)
Chloride: 102 mmol/L (ref 96–106)
Creatinine, Ser: 0.72 mg/dL (ref 0.57–1.00)
GFR calc Af Amer: 133 mL/min/{1.73_m2} (ref >59)
GFR calc non Af Amer: 115 mL/min/{1.73_m2} (ref >59)
Globulin, Total: 3.2 g/dL (ref 1.5–4.5)
Glucose: 81 mg/dL (ref 65–99)
Potassium: 4 mmol/L (ref 3.5–5.2)
Sodium: 138 mmol/L (ref 134–144)
TOTAL PROTEIN: 7.4 g/dL (ref 6.0–8.5)

## 2018-11-24 LAB — CBC
Hematocrit: 38.4 % (ref 34.0–46.6)
Hemoglobin: 12.8 g/dL (ref 11.1–15.9)
MCH: 29.6 pg (ref 26.6–33.0)
MCHC: 33.3 g/dL (ref 31.5–35.7)
MCV: 89 fL (ref 79–97)
Platelets: 285 10*3/uL (ref 150–450)
RBC: 4.32 x10E6/uL (ref 3.77–5.28)
RDW: 12.8 % (ref 11.7–15.4)
WBC: 7.8 10*3/uL (ref 3.4–10.8)

## 2018-11-24 LAB — LIPID PANEL
CHOLESTEROL TOTAL: 176 mg/dL (ref 100–199)
Chol/HDL Ratio: 3.4 ratio (ref 0.0–4.4)
HDL: 52 mg/dL (ref >39)
LDL Calculated: 106 mg/dL — ABNORMAL HIGH (ref 0–99)
Triglycerides: 89 mg/dL (ref 0–149)
VLDL Cholesterol Cal: 18 mg/dL (ref 5–40)

## 2018-11-24 LAB — HEMOGLOBIN A1C
Est. average glucose Bld gHb Est-mCnc: 97 mg/dL
Hgb A1c MFr Bld: 5 % (ref 4.8–5.6)

## 2018-11-24 LAB — HIV ANTIBODY (ROUTINE TESTING W REFLEX): HIV Screen 4th Generation wRfx: NONREACTIVE

## 2018-11-24 LAB — RPR: RPR Ser Ql: NONREACTIVE

## 2018-11-26 LAB — SPECIMEN STATUS REPORT

## 2018-11-26 LAB — T4, FREE: Free T4: 1.23 ng/dL (ref 0.82–1.77)

## 2018-11-26 LAB — T3, FREE: T3, Free: 3.9 pg/mL (ref 2.0–4.4)

## 2018-11-27 LAB — CYTOLOGY - PAP
Chlamydia: NEGATIVE
Diagnosis: NEGATIVE
Neisseria Gonorrhea: NEGATIVE
Trichomonas: NEGATIVE

## 2018-12-01 ENCOUNTER — Encounter: Payer: Self-pay | Admitting: Nurse Practitioner

## 2018-12-01 ENCOUNTER — Ambulatory Visit: Payer: BC Managed Care – PPO | Admitting: Nurse Practitioner

## 2018-12-01 VITALS — BP 114/79 | HR 75 | Temp 97.3°F | Ht 61.0 in | Wt 216.0 lb

## 2018-12-01 DIAGNOSIS — E039 Hypothyroidism, unspecified: Secondary | ICD-10-CM | POA: Diagnosis not present

## 2018-12-01 MED ORDER — LEVOTHYROXINE SODIUM 50 MCG PO TABS: 50.0000 ug | ORAL_TABLET | Freq: Every day | ORAL | 3 refills | Status: DC

## 2018-12-01 NOTE — Patient Instructions (Signed)
Hypothyroidism  Hypothyroidism is when the thyroid gland does not make enough of certain hormones (it is underactive). The thyroid gland is a small gland located in the lower front part of the neck, just in front of the windpipe (trachea). This gland makes hormones that help control how the body uses food for energy (metabolism) as well as how the heart and brain function. These hormones also play a role in keeping your bones strong. When the thyroid is underactive, it produces too little of the hormones thyroxine (T4) and triiodothyronine (T3). What are the causes? This condition may be caused by:  Hashimoto's disease. This is a disease in which the body's disease-fighting system (immune system) attacks the thyroid gland. This is the most common cause.  Viral infections.  Pregnancy.  Certain medicines.  Birth defects.  Past radiation treatments to the head or neck for cancer.  Past treatment with radioactive iodine.  Past exposure to radiation in the environment.  Past surgical removal of part or all of the thyroid.  Problems with a gland in the center of the brain (pituitary gland).  Lack of enough iodine in the diet. What increases the risk? You are more likely to develop this condition if:  You are female.  You have a family history of thyroid conditions.  You use a medicine called lithium.  You take medicines that affect the immune system (immunosuppressants). What are the signs or symptoms? Symptoms of this condition include:  Feeling as though you have no energy (lethargy).  Not being able to tolerate cold.  Weight gain that is not explained by a change in diet or exercise habits.  Lack of appetite.  Dry skin.  Coarse hair.  Menstrual irregularity.  Slowing of thought processes.  Constipation.  Sadness or depression. How is this diagnosed? This condition may be diagnosed based on:  Your symptoms, your medical history, and a physical exam.  Blood  tests. You may also have imaging tests, such as an ultrasound or MRI. How is this treated? This condition is treated with medicine that replaces the thyroid hormones that your body does not make. After you begin treatment, it may take several weeks for symptoms to go away. Follow these instructions at home:  Take over-the-counter and prescription medicines only as told by your health care provider.  If you start taking any new medicines, tell your health care provider.  Keep all follow-up visits as told by your health care provider. This is important. ? As your condition improves, your dosage of thyroid hormone medicine may change. ? You will need to have blood tests regularly so that your health care provider can monitor your condition. Contact a health care provider if:  Your symptoms do not get better with treatment.  You are taking thyroid replacement medicine and you: ? Sweat a lot. ? Have tremors. ? Feel anxious. ? Lose weight rapidly. ? Cannot tolerate heat. ? Have emotional swings. ? Have diarrhea. ? Feel weak. Get help right away if you have:  Chest pain.  An irregular heartbeat.  A rapid heartbeat.  Difficulty breathing. Summary  Hypothyroidism is when the thyroid gland does not make enough of certain hormones (it is underactive).  When the thyroid is underactive, it produces too little of the hormones thyroxine (T4) and triiodothyronine (T3).  The most common cause is Hashimoto's disease, a disease in which the body's disease-fighting system (immune system) attacks the thyroid gland. The condition can also be caused by viral infections, medicine, pregnancy, or past   radiation treatment to the head or neck.  Symptoms may include weight gain, dry skin, constipation, feeling as though you do not have energy, and not being able to tolerate cold.  This condition is treated with medicine to replace the thyroid hormones that your body does not make. This information  is not intended to replace advice given to you by your health care provider. Make sure you discuss any questions you have with your health care provider. Document Released: 10/11/2005 Document Revised: 09/21/2017 Document Reviewed: 09/21/2017 Elsevier Interactive Patient Education  2019 [employer]  

## 2018-12-01 NOTE — Progress Notes (Signed)
   Subjective:    Patient ID: Shelley Whitehead, female    DOB: 10-20-1991, 28 y.o.   MRN: [id number]   Chief Complaint: Discuss thyroid labs   HPI Patient was seen by GYN and they did labs and found her tsh to 7.9. She does feel tired all the time.    Review of Systems  Constitutional: Negative.   HENT: Negative.   Respiratory: Negative.   Cardiovascular: Negative.   Genitourinary: Negative.   Neurological: Negative.   Psychiatric/Behavioral: Negative.   All other systems reviewed and are negative.      Objective:   Physical Exam Vitals signs and nursing note reviewed.  Constitutional:      General: She is not in acute distress.    Appearance: She is normal weight.  Cardiovascular:     Rate and Rhythm: Normal rate and regular rhythm.  Pulmonary:     Breath sounds: Normal breath sounds.  Skin:    General: Skin is warm and dry.  Neurological:     General: No focal deficit present.     Mental Status: She is alert and oriented to person, place, and time.  Psychiatric:        Mood and Affect: Mood normal.        Behavior: Behavior normal.    BP 114/79   Pulse 75   Temp (!) 97.3 F (36.3 C) (Oral)   Ht 5\' 1"  (1.549 m)   Wt 216 lb (98 kg)   LMP 11/23/2018 (Exact Date)   BMI 40.81 kg/m          Assessment & Plan:  Shelley Whitehead in today with chief complaint of Discuss thyroid labs   1. Acquired hypothyroidism Need to recheck labs in 8 weeks - levothyroxine (SYNTHROID, LEVOTHROID) 50 MCG tablet; Take 1 tablet (50 mcg total) by mouth daily.  Dispense: 90 tablet; Refill: 3   Mary-Margaret Daphine Deutscher, FNP

## 2018-12-13 ENCOUNTER — Telehealth: Payer: Self-pay | Admitting: Nurse Practitioner

## 2018-12-13 DIAGNOSIS — E039 Hypothyroidism, unspecified: Secondary | ICD-10-CM

## 2018-12-14 NOTE — Telephone Encounter (Signed)
Patient complains about severe dizziness since starting levothyroxine 3 weeks ago and fainted at work yesterday.  Patient would also like a referral to endocrinology

## 2018-12-14 NOTE — Addendum Note (Signed)
Addended by: Bennie Pierini on: 12/14/2018 03:53 PM   Modules accepted: Orders

## 2018-12-14 NOTE — Telephone Encounter (Signed)
Will do referral to endo Says she just feels dizzy since starting meds

## 2018-12-14 NOTE — Telephone Encounter (Signed)
Patient aware.  Would like a Actor in [place]

## 2019-02-13 ENCOUNTER — Encounter: Payer: Self-pay | Admitting: Nurse Practitioner

## 2019-02-13 ENCOUNTER — Ambulatory Visit (INDEPENDENT_AMBULATORY_CARE_PROVIDER_SITE_OTHER): Payer: BC Managed Care – PPO | Admitting: Nurse Practitioner

## 2019-02-13 ENCOUNTER — Other Ambulatory Visit: Payer: Self-pay

## 2019-02-13 DIAGNOSIS — F988 Other specified behavioral and emotional disorders with onset usually occurring in childhood and adolescence: Secondary | ICD-10-CM

## 2019-02-13 DIAGNOSIS — E039 Hypothyroidism, unspecified: Secondary | ICD-10-CM | POA: Diagnosis not present

## 2019-02-13 DIAGNOSIS — F410 Panic disorder [episodic paroxysmal anxiety] without agoraphobia: Secondary | ICD-10-CM

## 2019-02-13 MED ORDER — LISDEXAMFETAMINE DIMESYLATE 40 MG PO CAPS: 40.0000 mg | ORAL_CAPSULE | ORAL | 0 refills | Status: DC

## 2019-02-13 MED ORDER — ALPRAZOLAM 0.25 MG PO TABS: 0.2500 mg | ORAL_TABLET | Freq: Two times a day (BID) | ORAL | 0 refills | Status: DC | PRN

## 2019-02-13 NOTE — Progress Notes (Signed)
Patient ID: [name], female   DOB: Jan 10, 1991, 28 y.o.   MRN: [id number]    Virtual Visit via telephone Note  I connected withMegan Umeda on 02/13/19 at8:00 AM by telephone and verified that I am speaking with the correct person using two identifiers. Arman FilterMegan Sontag is currently located at home and no one is currently with her during visit. The provider, Mary-Margaret Daphine DeutscherMartin, FNP is located in their office at time of visit.  I discussed the limitations, risks, security and privacy concerns of performing an evaluation and management service by telephone and the availability of in person appointments. I also discussed with the patient that there may be a patient responsible charge related to this service. The patient expressed understanding and agreed to proceed.   History and Present Illness:   Chief Complaint: medical management if chronic issues   HPI:  1. Acquired hypothyroidism Patient is currently on levothyroxin 50MCG. Her last TSH was 7.9 and that is when she started on levothyroxin. Saw endocrinologist 2 weeks ago and her TSH there was around 3.3  2. Attention deficit disorder (ADD) without hyperactivity She has been on vyvanse for some time now. She is not able to concentrate unless she has her meds.        Outpatient Encounter Medications as of 02/13/2019  Medication Sig  . ALPRAZolam (XANAX) 0.25 MG tablet Take 1 tablet (0.25 mg total) by mouth 2 (two) times daily as needed for anxiety.  Marland Kitchen. levothyroxine (SYNTHROID, LEVOTHROID) 50 MCG tablet Take 1 tablet (50 mcg total) by mouth daily.  Marland Kitchen. lisdexamfetamine (VYVANSE) 40 MG capsule Take 1 capsule (40 mg total) by mouth every morning.  . lisdexamfetamine (VYVANSE) 40 MG capsule Take 1 capsule (40 mg total) by mouth every morning.  . lisdexamfetamine (VYVANSE) 40 MG capsule Take 1 capsule (40 mg total) by mouth every morning.  . norethindrone-ethinyl estradiol-iron (JUNEL FE 1.5/30) 1.5-30 MG-MCG tablet  Take 1 tablet by mouth daily.     New complaints: Anxiety levels have increased since she has been out of work. She has had several panic attacks and has had to use her xanax.  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       Review of Systems  Constitutional: Negative for diaphoresis and weight loss.  Eyes: Negative for blurred vision, double vision and pain.  Respiratory: Negative for shortness of breath.   Cardiovascular: Negative for chest pain, palpitations, orthopnea and leg swelling.  Gastrointestinal: Negative for abdominal pain.  Skin: Negative for rash.  Neurological: Negative for dizziness, sensory change, loss of consciousness, weakness and headaches.  Endo/Heme/Allergies: Negative for polydipsia. Does not bruise/bleed easily.  Psychiatric/Behavioral: Negative for memory loss. The patient does not have insomnia.   All other systems reviewed and are negative.    Observations/Objective: Alert and oriented- answers all questions approrpriately  Assessment and Plan: Arman FilterMegan Paullin comes in today with chief complaint of Medical Management of Chronic Issues   Diagnosis and orders addressed:  1. Acquired hypothyroidism Will follow up with endocrinology in 3 months  2. Attention deficit disorder (ADD) without hyperactivity Stress management discussed - lisdexamfetamine (VYVANSE) 40 MG capsule; Take 1 capsule (40 mg total) by mouth every morning for 30 days.  Dispense: 30 capsule; Refill: 0 - lisdexamfetamine (VYVANSE) 40 MG capsule; Take 1 capsule (40 mg total) by mouth every morning for 30 days.  Dispense: 30 capsule; Refill: 0 - lisdexamfetamine (VYVANSE) 40 MG capsule; Take 1 capsule (40 mg total) by mouth every  morning for 30 days.  Dispense: 30 capsule; Refill: 0  3. Panic attacks - ALPRAZolam (XANAX) 0.25 MG tablet; Take 1 tablet (0.25 mg total) by mouth 2 (two) times daily as needed for anxiety.  Dispense: 40  tablet; Refill: 0   Previous lab results reviewed Health Maintenance reviewed Diet and exercise encouraged    Follow Up Instructions: 3 months   I discussed the assessment and treatment plan with the patient. The patient was provided an opportunity to ask questions and all were answered. The patient agreed with the plan and demonstrated an understanding of the instructions.  The patient was advised to call back or seek an in-person evaluation if the symptoms worsen or if the condition fails to improve as anticipated.  The above assessment and management plan was discussed with the patient. The patient verbalized understanding of and has agreed to the management plan. Patient is aware to call the clinic if symptoms persist or worsen. Patient is aware when to return to the clinic for a follow-up visit. Patient educated on when it is appropriate to go to the emergency department.    I provided 8 minutes of non-face-to-face time during this encounter.    Mary-Margaret Daphine Deutscher, FNP

## 2019-07-11 ENCOUNTER — Other Ambulatory Visit: Payer: Self-pay | Admitting: Nurse Practitioner

## 2019-07-11 DIAGNOSIS — F410 Panic disorder [episodic paroxysmal anxiety] without agoraphobia: Secondary | ICD-10-CM

## 2019-07-12 ENCOUNTER — Other Ambulatory Visit: Payer: Self-pay

## 2019-07-13 ENCOUNTER — Encounter: Payer: Self-pay | Admitting: Nurse Practitioner

## 2019-07-13 ENCOUNTER — Ambulatory Visit: Payer: BC Managed Care – PPO | Admitting: Nurse Practitioner

## 2019-07-13 VITALS — BP 118/81 | HR 72 | Temp 98.6°F | Resp 16 | Ht 61.0 in | Wt 236.0 lb

## 2019-07-13 DIAGNOSIS — F410 Panic disorder [episodic paroxysmal anxiety] without agoraphobia: Secondary | ICD-10-CM | POA: Diagnosis not present

## 2019-07-13 DIAGNOSIS — F332 Major depressive disorder, recurrent severe without psychotic features: Secondary | ICD-10-CM

## 2019-07-13 DIAGNOSIS — F988 Other specified behavioral and emotional disorders with onset usually occurring in childhood and adolescence: Secondary | ICD-10-CM | POA: Diagnosis not present

## 2019-07-13 DIAGNOSIS — E039 Hypothyroidism, unspecified: Secondary | ICD-10-CM

## 2019-07-13 MED ORDER — LEVOTHYROXINE SODIUM 50 MCG PO TABS: 50.0000 ug | ORAL_TABLET | Freq: Every day | ORAL | 3 refills | Status: DC

## 2019-07-13 MED ORDER — LISDEXAMFETAMINE DIMESYLATE 40 MG PO CAPS: 40.0000 mg | ORAL_CAPSULE | ORAL | 0 refills | Status: DC

## 2019-07-13 MED ORDER — ALPRAZOLAM 0.25 MG PO TABS: 0.2500 mg | ORAL_TABLET | Freq: Two times a day (BID) | ORAL | 0 refills | Status: DC | PRN

## 2019-07-13 NOTE — Patient Instructions (Signed)

## 2019-07-13 NOTE — Progress Notes (Signed)
Subjective:    Patient ID: Shelley FilterMegan Whitehead, female    DOB: [birth year], 28 y.o.   MRN: [id number]   Chief Complaint: Medical Management of Chronic Issues    HPI:  1. Attention deficit disorder (ADD) without hyperactivity Patient has had ADHD for many years. She is currently on vyvanse 40mg  daily. She cannot concentrate on work without meds. 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  2. Acquired hypothyroidism No problems that aware of  3. Severe episode of recurrent major depressive disorder, without psychotic features (HCC) She is doing well right now. Slight depression because her mom died a year ago. [facility] 07[facility]13[facility]2019 12/01/2018 09/27/2018  Decreased Interest 0 1 1  Down, Depressed, Hopeless 0 1 1  PHQ - 2 Score 0 2 2  Altered sleeping - 1 1  Tired, decreased energy - 1 2  Change in appetite - 1 1  Feeling bad or failure about yourself  - 0 0  Trouble concentrating - 0 1  Moving slowly or fidgety/restless - 0 0  Suicidal thoughts - 0 0  PHQ-9 Score - 5 7     4. Panic attacks Doing well. Has not had any recent attacks.    Outpatient Encounter Medications as of 07/13/2019  Medication Sig  . ALPRAZolam (XANAX) 0.25 MG tablet Take 1 tablet (0.25 mg total) by mouth 2 (two) times daily as needed for anxiety.  Marland Kitchen. levothyroxine (SYNTHROID, LEVOTHROID) 50 MCG tablet Take 1 tablet (50 mcg total) by mouth daily.  . norethindrone-ethinyl estradiol-iron (JUNEL FE 1.5/30) 1.5-30 MG-MCG tablet Take 1 tablet by mouth daily.  Marland Kitchen. lisdexamfetamine (VYVANSE) 40 MG capsule Take 1 capsule (40 mg total) by mouth every morning for 30 days.  Marland Kitchen. lisdexamfetamine (VYVANSE) 40 MG capsule Take 1 capsule (40 mg total) by mouth every morning for 30 days.  Marland Kitchen. lisdexamfetamine (VYVANSE) 40 MG capsule Take 1 capsule (40 mg total) by mouth every morning for 30 days.     Past Surgical History:  Procedure Laterality Date  . polyp removed as child      Family History   Problem Relation Age of Onset  . Diabetes Mother   . Heart disease Mother   . Cervical cancer Mother   . Breast cancer Paternal Grandmother     New complaints: None today  Social history: Lives by herself- owns her own house  Controlled substance contract: 07/13/19   Review of Systems  Constitutional: Negative for activity change and appetite change.  HENT: Negative.   Eyes: Negative for pain.  Respiratory: Negative for shortness of breath.   Cardiovascular: Negative for chest pain, palpitations and leg swelling.  Gastrointestinal: Negative for abdominal pain.  Endocrine: Negative for polydipsia.  Genitourinary: Negative.   Skin: Negative for rash.  Neurological: Negative for dizziness, weakness and headaches.  Hematological: Does not bruise/bleed easily.  Psychiatric/Behavioral: Negative.   All other systems reviewed and are negative.      Objective:   Physical Exam Vitals signs and nursing note reviewed.  Constitutional:      General: She is not in acute distress.    Appearance: Normal appearance. She is well-developed.  HENT:     Head: Normocephalic.     Nose: Nose normal.  Eyes:     Pupils: Pupils are equal, round, and reactive to light.  Neck:     Musculoskeletal: Normal range of motion and neck supple.     Vascular: No carotid bruit or JVD.  Cardiovascular:  Rate and Rhythm: Normal rate and regular rhythm.     Heart sounds: Normal heart sounds.  Pulmonary:     Effort: Pulmonary effort is normal. No respiratory distress.     Breath sounds: Normal breath sounds. No wheezing or rales.  Chest:     Chest wall: No tenderness.  Abdominal:     General: Bowel sounds are normal. There is no distension or abdominal bruit.     Palpations: Abdomen is soft. There is no hepatomegaly, splenomegaly, mass or pulsatile mass.     Tenderness: There is no abdominal tenderness.  Musculoskeletal: Normal range of motion.  Lymphadenopathy:     Cervical: No cervical  adenopathy.  Skin:    General: Skin is warm and dry.  Neurological:     Mental Status: She is alert and oriented to person, place, and time.     Deep Tendon Reflexes: Reflexes are normal and symmetric.  Psychiatric:        Behavior: Behavior normal.        Thought Content: Thought content normal.        Judgment: Judgment normal.    BP 118/81   Pulse 72   Temp 98.6 F (37 C) (Oral)   Resp 16   Ht 5\' 1"  (1.549 m)   Wt 236 lb (107 kg)   SpO2 98%   BMI 44.59 kg/m         Assessment & Plan:  Shelley Whitehead comes in today with chief complaint of Medical Management of Chronic Issues   Diagnosis and orders addressed:  1. Attention deficit disorder (ADD) without hyperactivity Stress management - lisdexamfetamine (VYVANSE) 40 MG capsule; Take 1 capsule (40 mg total) by mouth every morning.  Dispense: 30 capsule; Refill: 0 - lisdexamfetamine (VYVANSE) 40 MG capsule; Take 1 capsule (40 mg total) by mouth every morning.  Dispense: 30 capsule; Refill: 0 - lisdexamfetamine (VYVANSE) 40 MG capsule; Take 1 capsule (40 mg total) by mouth every morning.  Dispense: 30 capsule; Refill: 0  2. Acquired hypothyroidism Labs pending - levothyroxine (SYNTHROID) 50 MCG tablet; Take 1 tablet (50 mcg total) by mouth daily.  Dispense: 90 tablet; Refill: 3  3. Severe episode of recurrent major depressive disorder, without psychotic features ([place])  4. Panic attacks - ALPRAZolam (XANAX) 0.25 MG tablet; Take 1 tablet (0.25 mg total) by mouth 2 (two) times daily as needed for anxiety.  Dispense: 40 tablet; Refill: 0   Labs pending Health Maintenance reviewed Diet and exercise encouraged  Follow up plan: 3 months   Mary-Margaret Hassell Done, FNP

## 2019-08-13 ENCOUNTER — Ambulatory Visit (INDEPENDENT_AMBULATORY_CARE_PROVIDER_SITE_OTHER): Payer: BC Managed Care – PPO | Admitting: Nurse Practitioner

## 2019-08-13 ENCOUNTER — Encounter: Payer: Self-pay | Admitting: Nurse Practitioner

## 2019-08-13 DIAGNOSIS — L209 Atopic dermatitis, unspecified: Secondary | ICD-10-CM | POA: Diagnosis not present

## 2019-08-13 MED ORDER — PHENTERMINE HCL 37.5 MG PO TABS: 37.5000 mg | ORAL_TABLET | Freq: Every day | ORAL | 3 refills | Status: DC

## 2019-08-13 NOTE — Progress Notes (Signed)
Virtual Visit via telephone Note Due to COVID-19 pandemic this visit was conducted virtually. This visit type was conducted due to national recommendations for restrictions regarding the COVID-19 Pandemic (e.g. social distancing, sheltering in place) in an effort to limit this patient's exposure and mitigate transmission in our community. All issues noted in this document were discussed and addressed.  A physical exam was not performed with this format.  I connected with Shelley Whitehead on 08/13/19 at 9:00 by telephone and verified that I am speaking with the correct person using two identifiers. Shelley Whitehead is currently located at work and she is currently with no one during visit. The provider, Mary-Margaret Hassell Done, FNP is located in their office at time of visit.  I discussed the limitations, risks, security and privacy concerns of performing an evaluation and management service by telephone and the availability of in person appointments. I also discussed with the patient that there may be a patient responsible charge related to this service. The patient expressed understanding and agreed to proceed.   History and Present Illness:   Chief Complaint: Weight Gain    HPI:  Patient has gained 50 lbs since January. She has not been doing any exercise. She is also c/o of a rash on bil lower ext. She has been scratching them so much that she has scabs on them. She has changed her laundry detergent and soaps since rash started, and that made no change   Outpatient Encounter Medications as of 08/13/2019  Medication Sig  . ALPRAZolam (XANAX) 0.25 MG tablet Take 1 tablet (0.25 mg total) by mouth 2 (two) times daily as needed for anxiety.  Marland Kitchen levothyroxine (SYNTHROID) 50 MCG tablet Take 1 tablet (50 mcg total) by mouth daily.  Derrill Memo ON 09/11/2019] lisdexamfetamine (VYVANSE) 40 MG capsule Take 1 capsule (40 mg total) by mouth every morning.  . lisdexamfetamine (VYVANSE) 40 MG capsule Take 1  capsule (40 mg total) by mouth every morning.  . lisdexamfetamine (VYVANSE) 40 MG capsule Take 1 capsule (40 mg total) by mouth every morning.  . norethindrone-ethinyl estradiol-iron (JUNEL FE 1.5/30) 1.5-30 MG-MCG tablet Take 1 tablet by mouth daily.       Past Surgical History:  Procedure Laterality Date  . polyp removed as child      Family History  Problem Relation Age of Onset  . Diabetes Mother   . Heart disease Mother   . Cervical cancer Mother   . Breast cancer Paternal Grandmother     New complaints: None today  Social history: Lives alone and is a 4th grade   Controlled substance contract: will have signed when has next face to face visit.    Review of Systems  Constitutional: Negative for diaphoresis and weight loss.  Eyes: Negative for blurred vision, double vision and pain.  Respiratory: Negative for shortness of breath.   Cardiovascular: Negative for chest pain, palpitations, orthopnea and leg swelling.  Gastrointestinal: Negative for abdominal pain.  Skin: Negative for rash.  Neurological: Negative for dizziness, sensory change, loss of consciousness, weakness and headaches.  Endo/Heme/Allergies: Negative for polydipsia. Does not bruise/bleed easily.  Psychiatric/Behavioral: Negative for memory loss. The patient does not have insomnia.   All other systems reviewed and are negative.    Observations/Objective: Alert and oriented- answers all questions appropriately No distress    Assessment and Plan: Shelley Whitehead comes in today with chief complaint of Weight Gain   Diagnosis and orders addressed:  1. Atopic dermatitis, unspecified type Avoid scratching Take beandryl -  Ambulatory referral to Dermatology  2. Morbid obesity (HCC) Ow fat diet exercise daily - phentermine (ADIPEX-P) 37.5 MG tablet; Take 1 tablet (37.5 mg total) by mouth daily before breakfast.  Dispense: 30 tablet; Refill: 3   Follow up plan: 3 months    I discussed  the assessment and treatment plan with the patient. The patient was provided an opportunity to ask questions and all were answered. The patient agreed with the plan and demonstrated an understanding of the instructions.   The patient was advised to call back or seek an in-person evaluation if the symptoms worsen or if the condition fails to improve as anticipated.  The above assessment and management plan was discussed with the patient. The patient verbalized understanding of and has agreed to the management plan. Patient is aware to call the clinic if symptoms persist or worsen. Patient is aware when to return to the clinic for a follow-up visit. Patient educated on when it is appropriate to go to the emergency department.   Time call ended:  9:15  I provided 15 minutes of non-face-to-face time during this encounter.    Mary-Margaret Daphine Deutscher, FNP

## 2019-09-10 ENCOUNTER — Other Ambulatory Visit: Payer: Self-pay

## 2019-10-13 ENCOUNTER — Other Ambulatory Visit: Payer: Self-pay | Admitting: Nurse Practitioner

## 2019-10-13 DIAGNOSIS — N920 Excessive and frequent menstruation with regular cycle: Secondary | ICD-10-CM

## 2019-10-24 ENCOUNTER — Ambulatory Visit (INDEPENDENT_AMBULATORY_CARE_PROVIDER_SITE_OTHER): Payer: BC Managed Care – PPO | Admitting: Nurse Practitioner

## 2019-10-24 ENCOUNTER — Other Ambulatory Visit: Payer: Self-pay

## 2019-10-24 ENCOUNTER — Encounter: Payer: Self-pay | Admitting: Nurse Practitioner

## 2019-10-24 DIAGNOSIS — F988 Other specified behavioral and emotional disorders with onset usually occurring in childhood and adolescence: Secondary | ICD-10-CM

## 2019-10-24 MED ORDER — LISDEXAMFETAMINE DIMESYLATE 40 MG PO CAPS: 40.0000 mg | ORAL_CAPSULE | Freq: Every day | ORAL | 0 refills | Status: DC

## 2019-10-24 NOTE — Progress Notes (Signed)
Virtual Visit via telephone Note Due to COVID-19 pandemic this visit was conducted virtually. This visit type was conducted due to national recommendations for restrictions regarding the COVID-19 Pandemic (e.g. social distancing, sheltering in place) in an effort to limit this patient's exposure and mitigate transmission in our community. All issues noted in this document were discussed and addressed.  A physical exam was not performed with this format.  I connected with Shelley Whitehead on 10/24/19 at 3:20 by telephone and verified that I am speaking with the correct person using two identifiers. Shelley Whitehead is currently located at home and no one is currently with  her during visit. The provider, Mary-Margaret Hassell Done, FNP is located in their office at time of visit.  I discussed the limitations, risks, security and privacy concerns of performing an evaluation and management service by telephone and the availability of in person appointments. I also discussed with the patient that there may be a patient responsible charge related to this service. The patient expressed understanding and agreed to proceed.   History and Present Illness:   Chief Complaint: ADHD   HPI Patient calls in for add follow up. She has had issues with ADD since she was in high school. She is currently vyvanse 40mg  daily. She denies any side effects. Has trouble  Concentrating on work without meds.    Review of Systems  Constitutional: Negative for diaphoresis and weight loss.  Eyes: Negative for blurred vision, double vision and pain.  Respiratory: Negative for shortness of breath.   Cardiovascular: Negative for chest pain, palpitations, orthopnea and leg swelling.  Gastrointestinal: Negative for abdominal pain.  Skin: Negative for rash.  Neurological: Negative for dizziness, sensory change, loss of consciousness, weakness and headaches.  Endo/Heme/Allergies: Negative for polydipsia. Does not bruise/bleed  easily.  Psychiatric/Behavioral: Negative for memory loss. The patient does not have insomnia.   All other systems reviewed and are negative.    Observations/Objective: Alert and oriented- answers all questions appropriately No distress    Assessment and Plan: Shelley Whitehead in today with chief complaint of ADHD   1. Attention deficit disorder (ADD) without hyperactivity Meds ordered this encounter  Medications  . lisdexamfetamine (VYVANSE) 40 MG capsule    Sig: Take 1 capsule (40 mg total) by mouth daily before breakfast.    Dispense:  30 capsule    Refill:  0    Order Specific Question:   Supervising Provider    Answer:   Caryl Pina A [id number]  . lisdexamfetamine (VYVANSE) 40 MG capsule    Sig: Take 1 capsule (40 mg total) by mouth daily before breakfast.    Dispense:  30 capsule    Refill:  0    Order Specific Question:   Supervising Provider    Answer:   Caryl Pina A [id number]  . lisdexamfetamine (VYVANSE) 40 MG capsule    Sig: Take 1 capsule (40 mg total) by mouth daily before breakfast.    Dispense:  30 capsule    Refill:  0    Order Specific Question:   Supervising Provider    Answer:   Worthy Rancher [[id number]]   Stress management   Follow Up Instructions: 3 month    I discussed the assessment and treatment plan with the patient. The patient was provided an opportunity to ask questions and all were answered. The patient agreed with the plan and demonstrated an understanding of the instructions.   The patient was advised to call back or seek an in-person evaluation  if the symptoms worsen or if the condition fails to improve as anticipated.  The above assessment and management plan was discussed with the patient. The patient verbalized understanding of and has agreed to the management plan. Patient is aware to call the clinic if symptoms persist or worsen. Patient is aware when to return to the clinic for a follow-up visit. Patient educated on  when it is appropriate to go to the emergency department.   Time call ended:  3:33  I provided 13 minutes of non-face-to-face time during this encounter.    Mary-Margaret Daphine Deutscher, FNP

## 2019-11-26 NOTE — Progress Notes (Signed)
29 y.o. G0P0000 Single White or Caucasian Not Hispanic or Latino female here for annual exam. Patient states that she will start taking her Birth control again next week but she has been off of it for 2 month secondary to weight gain. She doesn't really think it was the pill and wants to restart.   Patient states that she had a rash and her pcp referred her to Dermatology who did not know what the rash was. The rash has resolved on it's own after 6 months. She would like a allergist referral, thinks the rash may have been an allergy.   She still c/o intermittent stabbing pain in the left side of her vagina. It lasts for 5-10 minutes, up to a 7/10 in severity. Typically occurs when she is sitting and comes about every 3 weeks, going on for a couple of years. In November, 2020 it was coming every few days, has let up again. No dyspareunia. Occasional vaginal d/c prior to her cycle, nothing abnormal. No itching, burning or irritation.   No constipation or diarrhea, no urinary symptoms.   She would like to do blood work to see if her thyroid levels are good.    In the process of breaking up with her long term boyfriend. Still friends. Still sexually active, exclusive. Not using condoms.   Cycle range from 28-32 days. Cramps are tolerable.  Period Cycle (Days): 30 Period Duration (Days): 4-5 Period Pattern: (!) Irregular Menstrual Flow: Heavy, Light Menstrual Control: Thin pad, Tampon Menstrual Control Change Freq (Hours): 3-4 Dysmenorrhea: (!) Moderate Dysmenorrhea Symptoms: Cramping, Headache   She has gained weight with covid and has lost some again. Was at a max of 250 lbs. Plans to get back to 170. Exercising every day, eating healthier.   Patient's last menstrual period was 11/28/2019.          Sexually active: yes but not often   The current method of family planning is abstinence.    Exercising: Yes.    Walking / jogging and utube  Smoker:  no  Health Maintenance: Pap: 11/23/18 neg   2015 History of abnormal Pap:  no TDaP:  11/23/2018 Gardasil: completed all 3    reports that she has never smoked. She has never used smokeless tobacco. She reports current alcohol use of about 2.0 - 3.0 standard drinks of alcohol per week. She reports that she does not use drugs. Elementary school teacher, 3rd grade. She loves teaching in person.   Past Medical History:  Diagnosis Date  . ADD (attention deficit disorder)   . Anxiety   . Dysmenorrhea     Past Surgical History:  Procedure Laterality Date  . polyp removed as child      Current Outpatient Medications  Medication Sig Dispense Refill  . levothyroxine (SYNTHROID) 50 MCG tablet Take 1 tablet (50 mcg total) by mouth daily. 90 tablet 3  . lisdexamfetamine (VYVANSE) 40 MG capsule Take 1 capsule (40 mg total) by mouth daily before breakfast. 30 capsule 0   No current facility-administered medications for this visit.    Family History  Problem Relation Age of Onset  . Diabetes Mother   . Heart disease Mother   . Cervical cancer Mother   . Breast cancer Paternal Grandmother     Review of Systems  Gastrointestinal: Positive for abdominal pain.  Skin: Positive for rash.  All other systems reviewed and are negative.   Exam:   BP (!) 118/32   Pulse 88   Temp 98.7  F (37.1 C)   Ht 5' 1.5" (1.562 m)   Wt 226 lb (102.5 kg)   LMP 11/28/2019   SpO2 97%   BMI 42.01 kg/m   Weight change: @WEIGHTCHANGE @ Height:   Height: 5' 1.5" (156.2 cm)  Ht Readings from Last 3 Encounters:  11/28/19 5' 1.5" (1.562 m)  07/13/19 5\' 1"  (1.549 m)  12/01/18 5\' 1"  (1.549 m)    General appearance: alert, cooperative and appears stated age Head: Normocephalic, without obvious abnormality, atraumatic Neck: no adenopathy, supple, symmetrical, trachea midline and thyroid normal to inspection and palpation Lungs: clear to auscultation bilaterally Cardiovascular: regular rate and rhythm Breasts: normal appearance, no masses or  tenderness Abdomen: soft, non-tender; non distended,  no masses,  no organomegaly Extremities: extremities normal, atraumatic, no cyanosis or edema Skin: Skin color, texture, turgor normal. No rashes or lesions Lymph nodes: Cervical, supraclavicular, and axillary nodes normal. No abnormal inguinal nodes palpated Neurologic: Grossly normal   Pelvic: External genitalia:  no lesions              Urethra:  normal appearing urethra with no masses, tenderness or lesions              Bartholins and Skenes: normal                 Vagina: normal appearing vagina with normal color and discharge, no lesions              Cervix: no cervical motion tenderness and no lesions               Bimanual Exam:  Uterus:  normal size, contour, position, consistency, mobility, non-tender and anteverted              Adnexa: no mass, fullness, tenderness               Rectovaginal: Confirms               Anus:  normal sphincter tone, no lesions  Pelvic floor: tight and tender bilaterally L>R  chaperoned for the exam.  A:  Well Woman with normal exam  BMI is 42, working on weight loss  Intermittent vaginal pain, pelvic floor is tight and tender  Contraception management, wants to restart OCP's  Hypothyroid, desires recheck      P:   No pap this year  Screening labs, HgbA1C, TSH  STD testing  Restart OCP's  Condoms if sexually active  Discussed breast self exam  Discussed calcium and vit D intake  Referral for pelvic floor PT

## 2019-11-28 ENCOUNTER — Ambulatory Visit (INDEPENDENT_AMBULATORY_CARE_PROVIDER_SITE_OTHER): Payer: BC Managed Care – PPO | Admitting: Obstetrics and Gynecology

## 2019-11-28 ENCOUNTER — Encounter: Payer: Self-pay | Admitting: Obstetrics and Gynecology

## 2019-11-28 ENCOUNTER — Other Ambulatory Visit: Payer: Self-pay

## 2019-11-28 VITALS — BP 118/32 | HR 88 | Temp 98.7°F | Ht 61.5 in | Wt 226.0 lb

## 2019-11-28 DIAGNOSIS — Z0000 Encounter for general adult medical examination without abnormal findings: Secondary | ICD-10-CM

## 2019-11-28 DIAGNOSIS — Z6841 Body Mass Index (BMI) 40.0 and over, adult: Secondary | ICD-10-CM

## 2019-11-28 DIAGNOSIS — Z833 Family history of diabetes mellitus: Secondary | ICD-10-CM | POA: Diagnosis not present

## 2019-11-28 DIAGNOSIS — Z113 Encounter for screening for infections with a predominantly sexual mode of transmission: Secondary | ICD-10-CM

## 2019-11-28 DIAGNOSIS — E039 Hypothyroidism, unspecified: Secondary | ICD-10-CM

## 2019-11-28 DIAGNOSIS — Z01419 Encounter for gynecological examination (general) (routine) without abnormal findings: Secondary | ICD-10-CM | POA: Diagnosis not present

## 2019-11-28 DIAGNOSIS — M6289 Other specified disorders of muscle: Secondary | ICD-10-CM

## 2019-11-28 DIAGNOSIS — R102 Pelvic and perineal pain: Secondary | ICD-10-CM

## 2019-11-28 NOTE — Patient Instructions (Signed)
EXERCISE AND DIET:  We recommended that you start or continue a regular exercise program for good health. Regular exercise means any activity that makes your heart beat faster and makes you sweat.  We recommend exercising at least 30 minutes per day at least 3 days a week, preferably 4 or 5.  We also recommend a diet low in fat and sugar.  Inactivity, poor dietary choices and obesity can cause diabetes, heart attack, stroke, and kidney damage, among others.    ALCOHOL AND SMOKING:  Women should limit their alcohol intake to no more than 7 drinks/beers/glasses of wine (combined, not each!) per week. Moderation of alcohol intake to this level decreases your risk of breast cancer and liver damage. And of course, no recreational drugs are part of a healthy lifestyle.  And absolutely no smoking or even second hand smoke. Most people know smoking can cause heart and lung diseases, but did you know it also contributes to weakening of your bones? Aging of your skin?  Yellowing of your teeth and nails?  CALCIUM AND VITAMIN D:  Adequate intake of calcium and Vitamin D are recommended.  The recommendations for exact amounts of these supplements seem to change often, but generally speaking 1,000 mg of calcium (between diet and supplement) and 800 units of Vitamin D per day seems prudent. Certain women may benefit from higher intake of Vitamin D.  If you are among these women, your doctor will have told you during your visit.    PAP SMEARS:  Pap smears, to check for cervical cancer or precancers,  have traditionally been done yearly, although recent scientific advances have shown that most women can have pap smears less often.  However, every woman still should have a physical exam from her gynecologist every year. It will include a breast check, inspection of the vulva and vagina to check for abnormal growths or skin changes, a visual exam of the cervix, and then an exam to evaluate the size and shape of the uterus and  ovaries.  And after 29 years of age, a rectal exam is indicated to check for rectal cancers. We will also provide age appropriate advice regarding health maintenance, like when you should have certain vaccines, screening for sexually transmitted diseases, bone density testing, colonoscopy, mammograms, etc.   MAMMOGRAMS:  All women over 40 years old should have a yearly mammogram. Many facilities now offer a "3D" mammogram, which may cost around $50 extra out of pocket. If possible,  we recommend you accept the option to have the 3D mammogram performed.  It both reduces the number of women who will be called back for extra views which then turn out to be normal, and it is better than the routine mammogram at detecting truly abnormal areas.    COLON CANCER SCREENING: Now recommend starting at age 45. At this time colonoscopy is not covered for routine screening until 50. There are take home tests that can be done between 45-49.   COLONOSCOPY:  Colonoscopy to screen for colon cancer is recommended for all women at age 50.  We know, you hate the idea of the prep.  We agree, BUT, having colon cancer and not knowing it is worse!!  Colon cancer so often starts as a polyp that can be seen and removed at colonscopy, which can quite literally save your life!  And if your first colonoscopy is normal and you have no family history of colon cancer, most women don't have to have it again for   10 years.  Once every ten years, you can do something that may end up saving your life, right?  We will be happy to help you get it scheduled when you are ready.  Be sure to check your insurance coverage so you understand how much it will cost.  It may be covered as a preventative service at no cost, but you should check your particular policy.      Breast Self-Awareness Breast self-awareness means being familiar with how your breasts look and feel. It involves checking your breasts regularly and reporting any changes to your  health care provider. Practicing breast self-awareness is important. A change in your breasts can be a sign of a serious medical problem. Being familiar with how your breasts look and feel allows you to find any problems early, when treatment is more likely to be successful. All women should practice breast self-awareness, including women who have had breast implants. How to do a breast self-exam One way to learn what is normal for your breasts and whether your breasts are changing is to do a breast self-exam. To do a breast self-exam: Look for Changes  1. Remove all the clothing above your waist. 2. Stand in front of a mirror in a room with good lighting. 3. Put your hands on your hips. 4. Push your hands firmly downward. 5. Compare your breasts in the mirror. Look for differences between them (asymmetry), such as: ? Differences in shape. ? Differences in size. ? Puckers, dips, and bumps in one breast and not the other. 6. Look at each breast for changes in your skin, such as: ? Redness. ? Scaly areas. 7. Look for changes in your nipples, such as: ? Discharge. ? Bleeding. ? Dimpling. ? Redness. ? A change in position. Feel for Changes Carefully feel your breasts for lumps and changes. It is best to do this while lying on your back on the floor and again while sitting or standing in the shower or tub with soapy water on your skin. Feel each breast in the following way:  Place the arm on the side of the breast you are examining above your head.  Feel your breast with the other hand.  Start in the nipple area and make  inch (2 cm) overlapping circles to feel your breast. Use the pads of your three middle fingers to do this. Apply light pressure, then medium pressure, then firm pressure. The light pressure will allow you to feel the tissue closest to the skin. The medium pressure will allow you to feel the tissue that is a little deeper. The firm pressure will allow you to feel the tissue  close to the ribs.  Continue the overlapping circles, moving downward over the breast until you feel your ribs below your breast.  Move one finger-width toward the center of the body. Continue to use the  inch (2 cm) overlapping circles to feel your breast as you move slowly up toward your collarbone.  Continue the up and down exam using all three pressures until you reach your armpit.  Write Down What You Find  Write down what is normal for each breast and any changes that you find. Keep a written record with breast changes or normal findings for each breast. By writing this information down, you do not need to depend only on memory for size, tenderness, or location. Write down where you are in your menstrual cycle, if you are still menstruating. If you are having trouble noticing differences   in your breasts, do not get discouraged. With time you will become more familiar with the variations in your breasts and more comfortable with the exam. How often should I examine my breasts? Examine your breasts every month. If you are breastfeeding, the best time to examine your breasts is after a feeding or after using a breast pump. If you menstruate, the best time to examine your breasts is 5-7 days after your period is over. During your period, your breasts are lumpier, and it may be more difficult to notice changes. When should I see my health care provider? See your health care provider if you notice:  A change in shape or size of your breasts or nipples.  A change in the skin of your breast or nipples, such as a reddened or scaly area.  Unusual discharge from your nipples.  A lump or thick area that was not there before.  Pain in your breasts.  Anything that concerns you.  

## 2019-11-29 LAB — CBC
Hematocrit: 37 % (ref 34.0–46.6)
Hemoglobin: 12.7 g/dL (ref 11.1–15.9)
MCH: 30.5 pg (ref 26.6–33.0)
MCHC: 34.3 g/dL (ref 31.5–35.7)
MCV: 89 fL (ref 79–97)
Platelets: 266 10*3/uL (ref 150–450)
RBC: 4.16 x10E6/uL (ref 3.77–5.28)
RDW: 13 % (ref 11.7–15.4)
WBC: 8.5 10*3/uL (ref 3.4–10.8)

## 2019-11-29 LAB — COMPREHENSIVE METABOLIC PANEL
ALT: 16 IU/L (ref 0–32)
AST: 15 IU/L (ref 0–40)
Albumin/Globulin Ratio: 1.6 (ref 1.2–2.2)
Albumin: 4.6 g/dL (ref 3.9–5.0)
Alkaline Phosphatase: 84 IU/L (ref 39–117)
BUN/Creatinine Ratio: 25 — ABNORMAL HIGH (ref 9–23)
BUN: 13 mg/dL (ref 6–20)
Bilirubin Total: 0.4 mg/dL (ref 0.0–1.2)
CO2: 21 mmol/L (ref 20–29)
Calcium: 9.7 mg/dL (ref 8.7–10.2)
Chloride: 103 mmol/L (ref 96–106)
Creatinine, Ser: 0.52 mg/dL — ABNORMAL LOW (ref 0.57–1.00)
GFR calc Af Amer: 150 mL/min/{1.73_m2} (ref >59)
GFR calc non Af Amer: 130 mL/min/{1.73_m2} (ref >59)
Globulin, Total: 2.8 g/dL (ref 1.5–4.5)
Glucose: 83 mg/dL (ref 65–99)
Potassium: 3.9 mmol/L (ref 3.5–5.2)
Sodium: 140 mmol/L (ref 134–144)
Total Protein: 7.4 g/dL (ref 6.0–8.5)

## 2019-11-29 LAB — HEMOGLOBIN A1C
Est. average glucose Bld gHb Est-mCnc: 100 mg/dL
Hgb A1c MFr Bld: 5.1 % (ref 4.8–5.6)

## 2019-11-29 LAB — RPR: RPR Ser Ql: NONREACTIVE

## 2019-11-29 LAB — HIV ANTIBODY (ROUTINE TESTING W REFLEX): HIV Screen 4th Generation wRfx: NONREACTIVE

## 2019-11-29 LAB — LIPID PANEL
Chol/HDL Ratio: 2.8 ratio (ref 0.0–4.4)
Cholesterol, Total: 168 mg/dL (ref 100–199)
HDL: 61 mg/dL (ref >39)
LDL Chol Calc (NIH): 95 mg/dL (ref 0–99)
Triglycerides: 60 mg/dL (ref 0–149)
VLDL Cholesterol Cal: 12 mg/dL (ref 5–40)

## 2019-11-29 LAB — THYROID PANEL WITH TSH
Free Thyroxine Index: 2.3 (ref 1.2–4.9)
T3 Uptake Ratio: 25 % (ref 24–39)
T4, Total: 9.3 ug/dL (ref 4.5–12.0)
TSH: 3.08 u[IU]/mL (ref 0.450–4.500)

## 2019-11-30 ENCOUNTER — Ambulatory Visit (INDEPENDENT_AMBULATORY_CARE_PROVIDER_SITE_OTHER): Payer: BC Managed Care – PPO | Admitting: Nurse Practitioner

## 2019-11-30 ENCOUNTER — Encounter: Payer: Self-pay | Admitting: Nurse Practitioner

## 2019-11-30 DIAGNOSIS — T7840XA Allergy, unspecified, initial encounter: Secondary | ICD-10-CM

## 2019-11-30 LAB — CHLAMYDIA/GONOCOCCUS/TRICHOMONAS, NAA
Chlamydia by NAA: NEGATIVE
Gonococcus by NAA: NEGATIVE
Trich vag by NAA: NEGATIVE

## 2019-11-30 NOTE — Progress Notes (Signed)
   Virtual Visit via telephone Note Due to COVID-19 pandemic this visit was conducted virtually. This visit type was conducted due to national recommendations for restrictions regarding the COVID-19 Pandemic (e.g. social distancing, sheltering in place) in an effort to limit this patient's exposure and mitigate transmission in our community. Whitehead issues noted in this document were discussed and addressed.  A physical exam was not performed with this format.  I connected with Shelley Whitehead on 11/30/19 at 11:15 by telephone and verified that I am speaking with the correct person using two identifiers. Shelley Whitehead is currently located at work and no one is currently with  her during visit. The provider, Shelley Daphine Deutscher, Shelley Whitehead is located in their office at time of visit.  I discussed the limitations, risks, security and privacy concerns of performing an evaluation and management service by telephone and the availability of in person appointments. I also discussed with the patient that there may be a patient responsible charge related to this service. The patient expressed understanding and agreed to proceed.   History and Present Illness:  Patient has develop a itchy rash again. She had for 6 months last year and was referred to dermatology and they did a punch of test and biopsy and they could not tell her where was coming from. Started again 2 weeks ago. Started on her legs and has spread upward   Review of Systems  Constitutional: Negative for diaphoresis and weight loss.  Eyes: Negative for blurred vision, double vision and pain.  Respiratory: Negative for shortness of breath.   Cardiovascular: Negative for chest pain, palpitations, orthopnea and leg swelling.  Gastrointestinal: Negative for abdominal pain.  Skin: Negative for rash.  Neurological: Negative for dizziness, sensory change, loss of consciousness, weakness and headaches.  Endo/Heme/Allergies: Negative for polydipsia. Does not  bruise/bleed easily.  Psychiatric/Behavioral: Negative for memory loss. The patient does not have insomnia.   Whitehead other systems reviewed and are negative.    Observations/Objective: Alert and oriented- answers Whitehead questions appropriately No distress    Assessment and Plan: Shelley Whitehead in today with chief complaint of Rash   1. Allergic rash present on examination Referral to allergist     Follow Up Instructions: prn    I discussed the assessment and treatment plan with the patient. The patient was provided an opportunity to ask questions and Whitehead were answered. The patient agreed with the plan and demonstrated an understanding of the instructions.   The patient was advised to call back or seek an in-person evaluation if the symptoms worsen or if the condition fails to improve as anticipated.  The above assessment and management plan was discussed with the patient. The patient verbalized understanding of and has agreed to the management plan. Patient is aware to call the clinic if symptoms persist or worsen. Patient is aware when to return to the clinic for a follow-up visit. Patient educated on when it is appropriate to go to the emergency department.   Time call ended:  11:25  I provided 10 minutes of non-face-to-face time during this encounter.    Shelley Daphine Deutscher, Shelley Whitehead

## 2019-12-17 ENCOUNTER — Telehealth: Payer: Self-pay

## 2019-12-17 NOTE — Telephone Encounter (Signed)
Prior auth initiated for Vyvance 40mg  #30 today through CoverMyMeds  Key: [id number] - PA Case ID: - Rx #: [id number]

## 2019-12-18 NOTE — Telephone Encounter (Signed)
Prior Auth for Vyvanse 40mg -Approved till 12/16/22  Pharmacy notified

## 2020-01-01 ENCOUNTER — Other Ambulatory Visit: Payer: Self-pay

## 2020-01-01 ENCOUNTER — Encounter: Payer: Self-pay | Admitting: Allergy & Immunology

## 2020-01-01 ENCOUNTER — Ambulatory Visit: Payer: BC Managed Care – PPO | Admitting: Allergy & Immunology

## 2020-01-01 VITALS — BP 124/84 | HR 81 | Temp 98.3°F | Resp 18 | Ht 62.0 in | Wt 226.6 lb

## 2020-01-01 DIAGNOSIS — J3089 Other allergic rhinitis: Secondary | ICD-10-CM

## 2020-01-01 DIAGNOSIS — L500 Allergic urticaria: Secondary | ICD-10-CM

## 2020-01-01 DIAGNOSIS — J302 Other seasonal allergic rhinitis: Secondary | ICD-10-CM | POA: Diagnosis not present

## 2020-01-01 MED ORDER — MONTELUKAST SODIUM 10 MG PO TABS: 10.0000 mg | ORAL_TABLET | Freq: Every day | ORAL | 5 refills | Status: DC

## 2020-01-01 NOTE — Patient Instructions (Addendum)
1. Seasonal and perennial allergic rhinitis - Testing today showed: horse, grasses, ragweed, weeds, trees, indoor molds, outdoor molds, dust mites, cat, dog and cockroach - Copy of test results provided.  - Avoidance measures provided. - Start taking: Xyzal (levocetirizine) 5mg  tablet TWICE daily and Singulair (montelukast) 10mg  daily - You can use an extra dose of the antihistamine, if needed, for breakthrough symptoms.  - Consider nasal saline rinses 1-2 times daily to remove allergens from the nasal cavities as well as help with mucous clearance (this is especially helpful to do before the nasal sprays are given) - Consider allergy shots as a means of long-term control. - Allergy shots "re-train" and "reset" the immune system to ignore environmental allergens and decrease the resulting immune response to those allergens (sneezing, itchy watery eyes, runny nose, nasal congestion, etc).    - Allergy shots improve symptoms in 75-85% of patients.  - We can discuss more at the next appointment if the medications are not working for you.  2. Allergic urticaria - I do not think that we need to do additional blood work at this point since you have multiple indoor and outdoor allergic triggers. - In addition, your peanut and soy testing was positive. - Continue to avoid peanuts. - Change from soy milk to something like oat milk (avoid coconut and rice milk since these do not contain much protein).  - Take the antihistamines as above, as these can suppress the hives.  - The Singulair can also help with the hives, although this can cause some irritability and weird dreams. - If the medications are not working, we can consider doing something like Xolair, which is a monthly injection to help with hives. - However, allergy shots might be more efficacious given your multiple sensitizations (but these are a big investment timewise, although they eventually space out once a month).  3. Return in about 6  weeks (around 02/12/2020). This can be an in-person, a virtual Webex or a telephone follow up visit.   Please inform 01-12-1996 of any Emergency Department visits, hospitalizations, or changes in symptoms. Call 02/14/2020 before going to the ED for breathing or allergy symptoms since we might be able to fit you in for a sick visit. Feel free to contact us anytime with any questions, problems, or concerns.  It was a pleasure to meet you today!  Websites that have reliable patient information: 1. American Academy of Asthma, Allergy, and Immunology: www.aaaai.org 2. Food Allergy Research and Education (FARE): foodallergy.org 3. Mothers of Asthmatics: http://www.asthmacommunitynetwork.org 4. American College of Allergy, Asthma, and Immunology: www.acaai.org   COVID-19 Vaccine Information can be found at: [place] For questions related to vaccine distribution or appointments, please email [email or web address] or call [phone].     "Like" [email or web address] on Facebook and Instagram for our latest updates!        Make sure you are registered to vote! If you have moved or changed any of your contact information, you will need to get this updated before voting!  In some cases, you MAY be able to register to vote online: [phone]    Reducing Pollen Exposure  The American Academy of Allergy, Asthma and Immunology suggests the following steps to reduce your exposure to pollen during allergy seasons.    1. Do not hang sheets or clothing out to dry; pollen may collect on these items. 2. Do not mow lawns or spend time around freshly cut grass; mowing stirs up pollen. 3. Keep windows closed at night.  Keep car  windows closed while driving. 4. Minimize morning activities outdoors, a time when pollen counts are usually at their highest. 5. Stay indoors as much as possible when pollen counts or humidity is high and  on windy days when pollen tends to remain in the air longer. 6. Use air conditioning when possible.  Many air conditioners have filters that trap the pollen spores. 7. Use a HEPA room air filter to remove pollen form the indoor air you breathe.  Control of Mold Allergen   Mold and fungi can grow on a variety of surfaces provided certain temperature and moisture conditions exist.  Outdoor molds grow on plants, decaying vegetation and soil.  The major outdoor mold, Alternaria and Cladosporium, are found in very high numbers during hot and dry conditions.  Generally, a late Summer - Fall peak is seen for common outdoor fungal spores.  Rain will temporarily lower outdoor mold spore count, but counts rise rapidly when the rainy period ends.  The most important indoor molds are Aspergillus and Penicillium.  Dark, humid and poorly ventilated basements are ideal sites for mold growth.  The next most common sites of mold growth are the bathroom and the kitchen.  Outdoor (Seasonal) Mold Control  Positive outdoor molds via skin testing: Alternaria, Cladosporium and Drechslera (Curvalaria)  1. Use air conditioning and keep windows closed 2. Avoid exposure to decaying vegetation. 3. Avoid leaf raking. 4. Avoid grain handling. 5. Consider wearing a face mask if working in moldy areas.  6.   Indoor (Perennial) Mold Control   Positive indoor molds via skin testing: Fusarium, Aureobasidium (Pullulara), Rhizopus, Candida and Trichophyton  1. Maintain humidity below 50%. 2. Clean washable surfaces with 5% bleach solution. 3. Remove sources e.g. contaminated carpets.     Control of Dog or Cat Allergen  Avoidance is the best way to manage a dog or cat allergy. If you have a dog or cat and are allergic to dog or cats, consider removing the dog or cat from the home. If you have a dog or cat but don't want to find it a new home, or if your family wants a pet even though someone in the household is allergic,  here are some strategies that may help keep symptoms at bay:  1. Keep the pet out of your bedroom and restrict it to only a few rooms. Be advised that keeping the dog or cat in only one room will not limit the allergens to that room. 2. Don't pet, hug or kiss the dog or cat; if you do, wash your hands with soap and water. 3. High-efficiency particulate air (HEPA) cleaners run continuously in a bedroom or living room can reduce allergen levels over time. 4. Regular use of a high-efficiency vacuum cleaner or a central vacuum can reduce allergen levels. 5. Giving your dog or cat a bath at least once a week can reduce airborne allergen.  Control of Dust Mite Allergen    Dust mites play a major role in allergic asthma and rhinitis.  They occur in environments with high humidity wherever human skin is found.  Dust mites absorb humidity from the atmosphere (ie, they do not drink) and feed on organic matter (including shed human and animal skin).  Dust mites are a microscopic type of insect that you cannot see with the naked eye.  High levels of dust mites have been detected from mattresses, pillows, carpets, upholstered furniture, bed covers, clothes, soft toys and any woven material.  The principal allergen of  the dust mite is found in its feces.  A gram of dust may contain 1,000 mites and 250,000 fecal particles.  Mite antigen is easily measured in the air during house cleaning activities.  Dust mites do not bite and do not cause harm to humans, other than by triggering allergies/asthma.    Ways to decrease your exposure to dust mites in your home:  1. Encase mattresses, box springs and pillows with a mite-impermeable barrier or cover   2. Wash sheets, blankets and drapes weekly in hot water (130 F) with detergent and dry them in a dryer on the hot setting.  3. Have the room cleaned frequently with a vacuum cleaner and a damp dust-mop.  For carpeting or rugs, vacuuming with a vacuum cleaner equipped  with a high-efficiency particulate air (HEPA) filter.  The dust mite allergic individual should not be in a room which is being cleaned and should wait 1 hour after cleaning before going into the room. 4. Do not sleep on upholstered furniture (eg, couches).   5. If possible removing carpeting, upholstered furniture and drapery from the home is ideal.  Horizontal blinds should be eliminated in the rooms where the person spends the most time (bedroom, study, television room).  Washable vinyl, roller-type shades are optimal. 6. Remove all non-washable stuffed toys from the bedroom.  Wash stuffed toys weekly like sheets and blankets above.   7. Reduce indoor humidity to less than 50%.  Inexpensive humidity monitors can be purchased at most hardware stores.  Do not use a humidifier as can make the problem worse and are not recommended.  Control of Cockroach Allergen  Cockroach allergen has been identified as an important cause of acute attacks of asthma, especially in urban settings.  There are fifty-five species of cockroach that exist in the [place], however only three, the [place], [place] species produce allergen that can affect patients with Asthma.  Allergens can be obtained from fecal particles, egg casings and secretions from cockroaches.    1. Remove food sources. 2. Reduce access to water. 3. Seal access and entry points. 4. Spray runways with 0.5-1% Diazinon or Chlorpyrifos 5. Blow boric acid power under stoves and refrigerator. 6. Place bait stations (hydramethylnon) at feeding sites.  Allergy Shots   Allergies are the result of a chain reaction that starts in the immune system. Your immune system controls how your body defends itself. For instance, if you have an allergy to pollen, your immune system identifies pollen as an invader or allergen. Your immune system overreacts by producing antibodies called Immunoglobulin E (IgE). These antibodies travel to cells that  release chemicals, causing an allergic reaction.  The concept behind allergy immunotherapy, whether it is received in the form of shots or tablets, is that the immune system can be desensitized to specific allergens that trigger allergy symptoms. Although it requires time and patience, the payback can be long-term relief.  How Do Allergy Shots Work?  Allergy shots work much like a vaccine. Your body responds to injected amounts of a particular allergen given in increasing doses, eventually developing a resistance and tolerance to it. Allergy shots can lead to decreased, minimal or no allergy symptoms.  There generally are two phases: build-up and maintenance. Build-up often ranges from three to six months and involves receiving injections with increasing amounts of the allergens. The shots are typically given once or twice a week, though more rapid build-up schedules are sometimes used.  The maintenance phase begins when  the most effective dose is reached. This dose is different for each person, depending on how allergic you are and your response to the build-up injections. Once the maintenance dose is reached, there are longer periods between injections, typically two to four weeks.  Occasionally doctors give cortisone-type shots that can temporarily reduce allergy symptoms. These types of shots are different and should not be confused with allergy immunotherapy shots.  Who Can Be Treated with Allergy Shots?  Allergy shots may be a good treatment approach for people with allergic rhinitis (hay fever), allergic asthma, conjunctivitis (eye allergy) or stinging insect allergy.   Before deciding to begin allergy shots, you should consider:  . The length of allergy season and the severity of your symptoms . Whether medications and/or changes to your environment can control your symptoms . Your desire to avoid long-term medication use . Time: allergy immunotherapy requires a major time  commitment . Cost: may vary depending on your insurance coverage  Allergy shots for children age 32 and older are effective and often well tolerated. They might prevent the onset of new allergen sensitivities or the progression to asthma.  Allergy shots are not started on patients who are pregnant but can be continued on patients who become pregnant while receiving them. In some patients with other medical conditions or who take certain common medications, allergy shots may be of risk. It is important to mention other medications you talk to your allergist.   When Will I Feel Better?  Some may experience decreased allergy symptoms during the build-up phase. For others, it may take as long as 12 months on the maintenance dose. If there is no improvement after a year of maintenance, your allergist will discuss other treatment options with you.  If you aren't responding to allergy shots, it may be because there is not enough dose of the allergen in your vaccine or there are missing allergens that were not identified during your allergy testing. Other reasons could be that there are high levels of the allergen in your environment or major exposure to non-allergic triggers like tobacco smoke.  What Is the Length of Treatment?  Once the maintenance dose is reached, allergy shots are generally continued for three to five years. The decision to stop should be discussed with your allergist at that time. Some people may experience a permanent reduction of allergy symptoms. Others may relapse and a longer course of allergy shots can be considered.  What Are the Possible Reactions?  The two types of adverse reactions that can occur with allergy shots are local and systemic. Common local reactions include very mild redness and swelling at the injection site, which can happen immediately or several hours after. A systemic reaction, which is less common, affects the entire body or a particular body system.  They are usually mild and typically respond quickly to medications. Signs include increased allergy symptoms such as sneezing, a stuffy nose or hives.  Rarely, a serious systemic reaction called anaphylaxis can develop. Symptoms include swelling in the throat, wheezing, a feeling of tightness in the chest, nausea or dizziness. Most serious systemic reactions develop within 30 minutes of allergy shots. This is why it is strongly recommended you wait in your doctor's office for 30 minutes after your injections. Your allergist is trained to watch for reactions, and his or her staff is trained and equipped with the proper medications to identify and treat them.  Who Should Administer Allergy Shots?  The preferred location for receiving shots  is your prescribing allergist's office. Injections can sometimes be given at another facility where the physician and staff are trained to recognize and treat reactions, and have received instructions by your prescribing allergist.

## 2020-01-01 NOTE — Progress Notes (Signed)
NEW PATIENT  Date of Service/Encounter:  01/01/20  Referring provider: Bennie Pierini, FNP   Assessment:   Seasonal and perennial allergic rhinitis (horse, grasses, ragweed, weeds, trees, indoor molds, outdoor molds, dust mites, cat, dog and cockroach)  Allergic urticaria  Plan/Recommendations:   1. Seasonal and perennial allergic rhinitis - Testing today showed: horse, grasses, ragweed, weeds, trees, indoor molds, outdoor molds, dust mites, cat, dog and cockroach - Copy of test results provided.  - Avoidance measures provided. - Start taking: Xyzal (levocetirizine) 5mg  tablet TWICE daily and Singulair (montelukast) 10mg  daily - You can use an extra dose of the antihistamine, if needed, for breakthrough symptoms.  - Consider nasal saline rinses 1-2 times daily to remove allergens from the nasal cavities as well as help with mucous clearance (this is especially helpful to do before the nasal sprays are given) - Consider allergy shots as a means of long-term control. - Allergy shots "re-train" and "reset" the immune system to ignore environmental allergens and decrease the resulting immune response to those allergens (sneezing, itchy watery eyes, runny nose, nasal congestion, etc).    - Allergy shots improve symptoms in 75-85% of patients.  - We can discuss more at the next appointment if the medications are not working for you.  2. Allergic urticaria - I do not think that we need to do additional blood work at this point since you have multiple indoor and outdoor allergic triggers. - In addition, your peanut and soy testing was positive. - Continue to avoid peanuts. - Change from soy milk to something like oat milk (avoid coconut and rice milk since these do not contain much protein).  - Take the antihistamines as above, as these can suppress the hives.  - The Singulair can also help with the hives, although this can cause some irritability and weird dreams. - If the  medications are not working, we can consider doing something like Xolair, which is a monthly injection to help with hives. - However, allergy shots might be more efficacious given your multiple sensitizations (but these are a big investment timewise, although they eventually space out once a month).  3. Return in about 6 weeks (around 02/12/2020). This can be an in-person, a virtual Webex or a telephone follow up visit.  Subjective:   Shelley Whitehead is a 29 y.o. female presenting today for evaluation of  Chief Complaint  Patient presents with  . Rash    Shelley Whitehead has a history of the following: Patient Active Problem List   Diagnosis Date Noted  . Panic attacks 02/13/2019  . Acquired hypothyroidism 12/01/2018  . ADD (attention deficit disorder) 07/27/2013  . Depression 01/14/2011    History obtained from: chart review and patient.  09/26/2013 was referred by 01/16/2011, FNP.     Shelley Whitehead is a 29 y.o. female presenting for an evaluation of allergies and a rash.  She has had a rash since July 2020. She tells me that it was after she went camping over the 4th of July. This resulted ins scars because she was scratching so intensely. It spread to her arms and stomach.   She has seen both the PCP and a dermatologist for this 07-12-2001). He did a biopsy that was "negative for everything". She does not have a copy of these at all. She never got a copy of it. She does not feel that the dermatologist did much for her.   The rash popped up again in January 2021. It started  back up on the legs. She has seen some improvement with the change of her detergents and soaps. She does not use detergent. Now it is on her back. He has been given a steroid shot for this, which did not help. She was also given triamcinolone which does not seem to help. She is beside herself trying to figure out what is going on with her.   Her rash starts as what she describes as "whelps". However, then  she starts having intense itching and the rash becomes more papular. She knows that this is because she continues to scratch even after the main whelp has resolved.  These are never associated with any particular foods, to her knowledge. She does not eat peanuts or tree nuts. She drinks soy milk since she does not like cow's milk. She has never had any problems with the whelps immediately after eating.    Otherwise, there is no history of other atopic diseases, including asthma, food allergies, drug allergies, stinging insect allergies, eczema or contact dermatitis. There is no significant infectious history. Vaccinations are up to date.    Past Medical History: Patient Active Problem List   Diagnosis Date Noted  . Panic attacks 02/13/2019  . Acquired hypothyroidism 12/01/2018  . ADD (attention deficit disorder) 07/27/2013  . Depression 01/14/2011    Medication List:  Allergies as of 01/01/2020   No Known Allergies     Medication List       Accurate as of January 01, 2020 10:56 PM. If you have any questions, ask your nurse or doctor.        levothyroxine 50 MCG tablet Commonly known as: SYNTHROID Take 1 tablet (50 mcg total) by mouth daily.   lisdexamfetamine 40 MG capsule Commonly known as: Vyvanse Take 1 capsule (40 mg total) by mouth daily before breakfast.   montelukast 10 MG tablet Commonly known as: SINGULAIR Take 1 tablet (10 mg total) by mouth at bedtime. Started by: Valentina Shaggy, MD       Birth History: non-contributory  Developmental History: non-contributory  Past Surgical History: Past Surgical History:  Procedure Laterality Date  . polyp removed as child       Family History: Family History  Problem Relation Age of Onset  . Diabetes Mother   . Heart disease Mother   . Cervical cancer Mother   . Breast cancer Paternal Grandmother      Social History: [name] lives at home with her family. She lives in a house without a dog in the home.  There is no smoking exposure at all. She works as a Pharmacist, hospital in [employer].     Review of Systems  Constitutional: Negative.  Negative for chills, fever, malaise/fatigue and weight loss.  HENT: Negative.  Negative for congestion, ear discharge, ear pain, sinus pain and sore throat.   Eyes: Negative for pain, discharge and redness.  Respiratory: Negative for cough, sputum production, shortness of breath and wheezing.   Cardiovascular: Negative.  Negative for chest pain and palpitations.  Gastrointestinal: Negative for abdominal pain, constipation, diarrhea, heartburn, nausea and vomiting.  Skin: Positive for itching and rash.  Neurological: Negative for dizziness and headaches.  Endo/Heme/Allergies: Negative for environmental allergies. Does not bruise/bleed easily.       Objective:   Blood pressure 124/84, pulse 81, temperature 98.3 F (36.8 C), temperature source Temporal, resp. rate 18, height 5\' 2"  (1.575 m), weight 226 lb 9.6 oz (102.8 kg), SpO2 98 %. Body mass index is 41.45 kg/m.  Physical Exam:   Physical Exam  Constitutional: She appears well-developed.  HENT:  Head: Normocephalic and atraumatic.  Right Ear: Tympanic membrane, external ear and ear canal normal.  Left Ear: Tympanic membrane, external ear and ear canal normal. No decreased hearing is noted.  Nose: Mucosal edema and rhinorrhea present. No nasal deformity or septal deviation. No epistaxis. Right sinus exhibits no maxillary sinus tenderness and no frontal sinus tenderness. Left sinus exhibits no maxillary sinus tenderness and no frontal sinus tenderness.  Mouth/Throat: Uvula is midline and oropharynx is clear and moist. Mucous membranes are not pale and not dry.  Tonsils enlarged bilaterally without exudates. There is cobblestoning present in the posterior oropharynx.   Eyes: Pupils are equal, round, and reactive to light. Conjunctivae and EOM are normal. Right eye exhibits no chemosis and no discharge. Left  eye exhibits no chemosis and no discharge. Right conjunctiva is not injected. Left conjunctiva is not injected.  Cardiovascular: Normal rate, regular rhythm and normal heart sounds.  Respiratory: Effort normal and breath sounds normal. No accessory muscle usage. No tachypnea. No respiratory distress. She has no wheezes. She has no rhonchi. She has no rales. She exhibits no tenderness.  Moving air well in all lung fields. No increased work of breathing noted.   Lymphadenopathy:    She has no cervical adenopathy.  Neurological: She is alert.  Skin: No abrasion, no petechiae and no rash noted. Rash is not papular, not vesicular and not urticarial. No erythema. No pallor.  No eczematous or urticarial lesions noted.   Psychiatric: She has a normal mood and affect.     Diagnostic studies:    Allergy Studies:    Airborne Adult Perc - 01/01/20 1459    Time Antigen Placed  1459    Allergen Manufacturer  Waynette Buttery    Location  Back    Number of Test  59    Panel 1  Select    1. Control-Buffer 50% Glycerol  Negative    2. Control-Histamine 1 mg/ml  2+    3. Albumin saline  Negative    4. Bahia  4+    5. [place]  4+    6. Johnson  4+    7. Kentucky Blue  4+    8. Meadow Fescue  4+    9. Perennial Rye  4+    10. Sweet Vernal  3+    11. Timothy  3+    12. Cocklebur  2+    13. Burweed Marshelder  3+    14. Ragweed, short  2+    15. Ragweed, Giant  3+    16. Plantain,  English  2+    17. Lamb's Quarters  2+    18. Sheep Sorrell  3+    19. Rough Pigweed  2+    20. Marsh Elder, Rough  2+    21. Mugwort, Common  3+    22. Ash mix  3+    23. Birch mix  3+    24. Beech American  3+    25. Box, Elder  3+    26. Cedar, red  3+    27. Cottonwood, Eastern  3+    28. Elm mix  3+    29. Hickory mix  3+    30. Maple mix  2+    31. Oak, Guinea[place]Bissau mix  3+    32. Pecan Pollen  3+    33. Pine mix  2+    34. Sycamore Guinea-Bissau  3+    35. Walnut, Black Pollen  3+    36. Alternaria alternata  2+     37. Cladosporium Herbarum  2+    38. Aspergillus mix  Negative    39. Penicillium mix  Negative    40. Bipolaris sorokiniana (Helminthosporium)  Negative    41. Drechslera spicifera (Curvularia)  2+    42. Mucor plumbeus  Negative    43. Fusarium moniliforme  3+    44. Aureobasidium pullulans (pullulara)  2+    45. Rhizopus oryzae  3+    46. Botrytis cinera  Negative    47. Epicoccum nigrum  Negative    48. Phoma betae  Negative    49. Candida Albicans  2+    50. Trichophyton mentagrophytes  2+    51. Mite, D Farinae  5,000 AU/ml  2+    52. Mite, D Pteronyssinus  5,000 AU/ml  2+    53. Cat Hair 10,000 BAU/ml  3+    54.  Dog Epithelia  3+    55. Mixed Feathers  Negative    56. Horse Epithelia  2+    57. Cockroach, German  2+    58. Mouse  2+    59. Tobacco Leaf  Negative     Food Perc - 01/01/20 1459    Time Antigen Placed  1459    Allergen Manufacturer  Waynette Buttery    Location  Back    Number of allergen test  10    1. Peanut  --   3x6   2. Soybean food  --   5x17   3. Wheat, whole  Negative    4. Sesame  Negative    5. Milk, cow  Negative    6. Egg White, chicken  Negative    7. Casein  Negative    8. Shellfish mix  Negative    9. Fish mix  Negative    10. Cashew  Negative       Allergy testing results were read and interpreted by myself, documented by clinical staff.         Malachi Bonds, MD [facility]

## 2020-01-06 ENCOUNTER — Other Ambulatory Visit: Payer: Self-pay | Admitting: Nurse Practitioner

## 2020-01-06 DIAGNOSIS — N920 Excessive and frequent menstruation with regular cycle: Secondary | ICD-10-CM

## 2020-02-12 ENCOUNTER — Encounter: Payer: Self-pay | Admitting: Allergy & Immunology

## 2020-02-12 ENCOUNTER — Other Ambulatory Visit: Payer: Self-pay

## 2020-02-12 ENCOUNTER — Ambulatory Visit: Payer: BC Managed Care – PPO | Admitting: Allergy & Immunology

## 2020-02-12 VITALS — BP 110/68 | HR 80 | Resp 16

## 2020-02-12 DIAGNOSIS — J3089 Other allergic rhinitis: Secondary | ICD-10-CM | POA: Diagnosis not present

## 2020-02-12 DIAGNOSIS — L500 Allergic urticaria: Secondary | ICD-10-CM

## 2020-02-12 DIAGNOSIS — J302 Other seasonal allergic rhinitis: Secondary | ICD-10-CM

## 2020-02-12 MED ORDER — LEVOCETIRIZINE DIHYDROCHLORIDE 5 MG PO TABS: 5.0000 mg | ORAL_TABLET | Freq: Every morning | ORAL | 5 refills | Status: DC

## 2020-02-12 NOTE — Progress Notes (Signed)
FOLLOW UP  Date of Service/Encounter:  02/12/20   Assessment:   Seasonal and perennial allergic rhinitis (horse, grasses, ragweed, weeds, trees, indoor molds, outdoor molds, dust mites, cat, dog and cockroach)  Allergic urticaria  Plan/Recommendations:   1. Seasonal and perennial allergic rhinitis (horse, grasses, ragweed, weeds, trees, indoor molds, outdoor molds, dust mites, cat, dog and cockroach) - Continue taking: Xyzal (levocetirizine) 5mg  tablet 1-2 times daily - You can use an extra dose of the antihistamine, if needed, for breakthrough symptoms.  - It seems that you have a good handle on your symptoms.  - We can hold off on allergy shots at this time.   2. 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 - EpiPen script is up to date (at the pharmacy if you want it).  3. Return in about 6 months (around 08/13/2020). This can be an in-person, a virtual Webex or a telephone follow up visit.   Subjective:   Shelley Whitehead is a 29 y.o. female presenting today for follow up of  Chief Complaint  Patient presents with  . Allergic Rhinitis   . Urticaria    Shelley Whitehead has a history of the following: Patient Active Problem List   Diagnosis Date Noted  . Panic attacks 02/13/2019  . Acquired hypothyroidism 12/01/2018  . ADD (attention deficit disorder) 07/27/2013  . Depression 01/14/2011    History obtained from: chart review and patient.  Shelley Whitehead is a 29 y.o. female presenting for a follow up visit.  She was last seen as a new patient in March 2021.  At that time, testing demonstrated positives to horse, grasses, ragweed, weeds, trees, indoor and outdoor molds, dust mite, cat, dog, and cockroach.  We started Xyzal 5 mg twice daily and Singulair 10 mg daily.  For her urticaria, we did not do additional blood work since there was so many triggers on her testing.  Peanut and soy was also positive and I recommended avoidance.  Since the last  visit, she has done well. She did notice some worsening with the Singulair and she stopped it. She is now using the Xyzal only once daily and this seems to control her symptoms. Seasonal allergy control has improved. Hives have been well controlled but she stopped the soy with improvement in her symptoms.  She thinks that soy was the main thing that was causing her hives to flare.  She has been reading labels like crazy and has been driving her crazy. She did NOT pick up her EpiPen since she was worried that it would be expensive. But she knows that it is at the pharmacy if needed.  She is back to school full time. She got her second Pfizer vaccine. 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 She works in April 2021, but lives in [place].   Otherwise, there have been no changes to her past medical history, surgical history, family history, or social history.    Review of Systems  Constitutional: Negative.  Negative for chills, fever, malaise/fatigue and weight loss.  HENT: Negative.  Negative for congestion, ear discharge, ear pain, sinus pain and sore throat.   Eyes: Negative for pain, discharge and redness.  Respiratory: Negative for cough, sputum production, shortness of breath and wheezing.   Cardiovascular: Negative.  Negative for chest pain and palpitations.  Gastrointestinal: Negative for abdominal pain, constipation, diarrhea, heartburn, nausea and vomiting.  Skin: Negative.  Negative for itching and rash.  Neurological: Negative for dizziness  and headaches.  Endo/Heme/Allergies: Negative for environmental allergies. Does not bruise/bleed easily.       Objective:   Blood pressure 110/68, pulse 80, resp. rate 16, SpO2 98 %. There is no height or weight on file to calculate BMI.   Physical Exam:  Physical Exam  Constitutional: She appears well-developed.  Pleasant female. Very talkative.   HENT:  Head: Normocephalic and atraumatic.  Right Ear:  Tympanic membrane, external ear and ear canal normal.  Left Ear: Tympanic membrane, external ear and ear canal normal.  Nose: Mucosal edema and rhinorrhea present. No nasal deformity or septal deviation. No epistaxis. Right sinus exhibits no maxillary sinus tenderness and no frontal sinus tenderness. Left sinus exhibits no maxillary sinus tenderness and no frontal sinus tenderness.  Mouth/Throat: Uvula is midline and oropharynx is clear and moist. Mucous membranes are not pale and not dry.  Eyes: Pupils are equal, round, and reactive to light. Conjunctivae and EOM are normal. Right eye exhibits no chemosis and no discharge. Left eye exhibits no chemosis and no discharge. Right conjunctiva is not injected. Left conjunctiva is not injected.  Cardiovascular: Normal rate, regular rhythm and normal heart sounds.  Respiratory: Effort normal and breath sounds normal. No accessory muscle usage. No tachypnea. No respiratory distress. She has no wheezes. She has no rhonchi. She has no rales. She exhibits no tenderness.  Moving air well in all lung fields.   Lymphadenopathy:    She has no cervical adenopathy.  Neurological: She is alert.  Skin: No abrasion, no petechiae and no rash noted. Rash is not papular, not vesicular and not urticarial. No erythema. No pallor.  No eczematous or urticarial lesions noted.   Psychiatric: She has a normal mood and affect.     Diagnostic studies: none    Salvatore Marvel, MD  [facility]

## 2020-02-12 NOTE — Patient Instructions (Addendum)
1. Seasonal and perennial allergic rhinitis (horse, grasses, ragweed, weeds, trees, indoor molds, outdoor molds, dust mites, cat, dog and cockroach) - Continue taking: Xyzal (levocetirizine) 5mg  tablet 1-2 times daily - You can use an extra dose of the antihistamine, if needed, for breakthrough symptoms.  - It seems that you have a good handle on your symptoms.  - We can hold off on allergy shots at this time.   2. 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 - EpiPen script is up to date (at the pharmacy if you want it).  3. Return in about 6 months (around 08/13/2020). This can be an in-person, a virtual Webex or a telephone follow up visit.   Please inform 08/15/2020 of any Emergency Department visits, hospitalizations, or changes in symptoms. Call us before going to the ED for breathing or allergy symptoms since we might be able to fit you in for a sick visit. Feel free to contact us anytime with any questions, problems, or concerns.  It was a pleasure to see you again today!  Websites that have reliable patient information: 1. American Academy of Asthma, Allergy, and Immunology: www.aaaai.org 2. Food Allergy Research and Education (FARE): foodallergy.org 3. Mothers of Asthmatics: http://www.asthmacommunitynetwork.org 4. American College of Allergy, Asthma, and Immunology: www.acaai.org   COVID-19 Vaccine Information can be found at: [place] For questions related to vaccine distribution or appointments, please email [email or web address] or call [phone].     "Like" [phone] on Facebook and Instagram for our latest updates!       HAPPY SPRING!  Make sure you are registered to vote! If you have moved or changed any of your contact information, you will need to get this updated before voting!  In some cases, you MAY be able to register to vote online:  [place]

## 2020-02-13 ENCOUNTER — Encounter: Payer: Self-pay | Admitting: Allergy & Immunology

## 2020-03-25 ENCOUNTER — Encounter: Payer: Self-pay | Admitting: Nurse Practitioner

## 2020-03-25 ENCOUNTER — Ambulatory Visit (INDEPENDENT_AMBULATORY_CARE_PROVIDER_SITE_OTHER): Payer: BC Managed Care – PPO | Admitting: Nurse Practitioner

## 2020-03-25 DIAGNOSIS — F988 Other specified behavioral and emotional disorders with onset usually occurring in childhood and adolescence: Secondary | ICD-10-CM

## 2020-03-25 MED ORDER — LISDEXAMFETAMINE DIMESYLATE 40 MG PO CAPS: 40.0000 mg | ORAL_CAPSULE | ORAL | 0 refills | Status: DC

## 2020-03-25 MED ORDER — LISDEXAMFETAMINE DIMESYLATE 40 MG PO CAPS: 40.0000 mg | ORAL_CAPSULE | Freq: Every day | ORAL | 0 refills | Status: DC

## 2020-03-25 NOTE — Progress Notes (Signed)
Patient ID: Shelley Whitehead, female   DOB: [birth year], 29 y.o.   MRN: [id number]    Virtual Visit via telephone Note Due to COVID-19 pandemic this visit was conducted virtually. This visit type was conducted due to national recommendations for restrictions regarding the COVID-19 Pandemic (e.g. social distancing, sheltering in place) in an effort to limit this patient's exposure and mitigate transmission in our community. All issues noted in this document were discussed and addressed.  A physical exam was not performed with this format.  I connected with [name] on 03/25/20 at 11:15 by telephone and verified that I am speaking with the correct person using two identifiers. Porchia Sinkler is currently located at work and her classroom is currently with her during visit. The provider, Mary-Margaret Daphine Deutscher, FNP is located in their office at time of visit.  I discussed the limitations, risks, security and privacy concerns of performing an evaluation and management service by telephone and the availability of in person appointments. I also discussed with the patient that there may be a patient responsible charge related to this service. The patient expressed understanding and agreed to proceed.   History and Present Illness:   Chief Complaint: ADHD   HPI Patient calls in for appointment for ADHD. 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 She has been on vyvanse for over 7 year. She has severe ADHD when he dpoe not take and has problem concentrating and completing tasks. The vavanse helps her stay focused so h ecan complete her work.      Review of Systems  Constitutional: Negative for diaphoresis and weight loss.  Eyes: Negative for blurred vision, double vision and pain.  Respiratory: Negative for shortness of breath.   Cardiovascular: Negative for chest pain, palpitations, orthopnea and leg swelling.  Gastrointestinal: Negative for abdominal pain.  Skin:  Negative for rash.  Neurological: Negative for dizziness, sensory change, loss of consciousness, weakness and headaches.  Endo/Heme/Allergies: Negative for polydipsia. Does not bruise/bleed easily.  Psychiatric/Behavioral: Negative for memory loss. The patient does not have insomnia.   All other systems reviewed and are negative.    Observations/Objective: Alert and oriented- answers all questions appropriately No distress sounds calm and relaxed today   Assessment and Plan:   Follow Up Instructions: Annalaura Sauseda in today with chief complaint of ADHD   1. 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  Dispense: 30 capsule; Refill: 0 - lisdexamfetamine (VYVANSE) 40 MG capsule; Take 1 capsule (40 mg total) by mouth every morning.  Dispense: 30 capsule; Refill: 0 - lisdexamfetamine (VYVANSE) 40 MG capsule; Take 1 capsule (40 mg total) by mouth every morning.  Dispense: 30 capsule; Refill: 0       I discussed the assessment and treatment plan with the patient. The patient was provided an opportunity to ask questions and all were answered. The patient agreed with the plan and demonstrated an understanding of the instructions.   The patient was advised to call back or seek an in-person evaluation if the symptoms worsen or if the condition fails to improve as anticipated.  The above assessment and management plan was discussed with the patient. The patient verbalized understanding of and has agreed to the management plan. Patient is aware to call the clinic if symptoms persist or worsen. Patient is aware when to return to the clinic for a follow-up visit. Patient  educated on when it is appropriate to go to the emergency department.   Time call ended:  11:28  I provided 13 minutes of non-face-to-face time during this encounter.    Mary-Margaret  Hassell Done, FNP

## 2020-08-03 ENCOUNTER — Other Ambulatory Visit: Payer: Self-pay | Admitting: Nurse Practitioner

## 2020-08-03 DIAGNOSIS — E039 Hypothyroidism, unspecified: Secondary | ICD-10-CM

## 2020-09-08 ENCOUNTER — Ambulatory Visit (INDEPENDENT_AMBULATORY_CARE_PROVIDER_SITE_OTHER): Payer: BC Managed Care – PPO | Admitting: Nurse Practitioner

## 2020-09-08 ENCOUNTER — Encounter: Payer: Self-pay | Admitting: Nurse Practitioner

## 2020-09-08 DIAGNOSIS — F988 Other specified behavioral and emotional disorders with onset usually occurring in childhood and adolescence: Secondary | ICD-10-CM

## 2020-09-08 DIAGNOSIS — F410 Panic disorder [episodic paroxysmal anxiety] without agoraphobia: Secondary | ICD-10-CM | POA: Diagnosis not present

## 2020-09-08 DIAGNOSIS — E039 Hypothyroidism, unspecified: Secondary | ICD-10-CM

## 2020-09-08 DIAGNOSIS — F332 Major depressive disorder, recurrent severe without psychotic features: Secondary | ICD-10-CM | POA: Diagnosis not present

## 2020-09-08 DIAGNOSIS — J00 Acute nasopharyngitis [common cold]: Secondary | ICD-10-CM

## 2020-09-08 MED ORDER — LISDEXAMFETAMINE DIMESYLATE 40 MG PO CAPS: 40.0000 mg | ORAL_CAPSULE | ORAL | 0 refills | Status: DC

## 2020-09-08 MED ORDER — LEVOTHYROXINE SODIUM 50 MCG PO TABS: 50.0000 ug | ORAL_TABLET | Freq: Every day | ORAL | 1 refills | Status: DC

## 2020-09-08 MED ORDER — LISDEXAMFETAMINE DIMESYLATE 40 MG PO CAPS: 40.0000 mg | ORAL_CAPSULE | Freq: Every day | ORAL | 0 refills | Status: DC

## 2020-09-08 NOTE — Progress Notes (Signed)
Patient ID: Shelley Whitehead, female   DOB: [birth year], 29 y.o.   MRN: [id number]    Virtual Visit via telephone Note Due to COVID-19 pandemic this visit was conducted virtually. This visit type was conducted due to national recommendations for restrictions regarding the COVID-19 Pandemic (e.g. social distancing, sheltering in place) in an effort to limit this patient's exposure and mitigate transmission in our community. All issues noted in this document were discussed and addressed.  A physical exam was not performed with this format.  I connected with Shelley Whitehead on 09/08/20 at 2:20 by telephone and verified that I am speaking with the correct person using two identifiers. Shelley Whitehead is currently located at home and no one is currently with her during visit. The provider, Mary-Margaret Daphine Deutscher, FNP is located in their office at time of visit.  I discussed the limitations, risks, security and privacy concerns of performing an evaluation and management service by telephone and the availability of in person appointments. I also discussed with the patient that there may be a patient responsible charge related to this service. The patient expressed understanding and agreed to proceed.   History and Present Illness:   Chief Complaint: Medical Management of Chronic Issues    HPI:  1. Attention deficit disorder (ADD) without hyperactivity Patient is on vyvanse 40mg  only on work days. She cannot concentrate without it. 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  2. Panic attacks Is doing better. No recent panic attacks. She is able to talk herself through them . She is going to see a new therapist next week.  3. Acquired hypothyroidism No problems that aware of. Lab Results  Component Value Date   TSH 3.080 11/28/2019     4. Severe episode of recurrent major depressive disorder, without psychotic features (HCC) Is currently not on any antidepresssants. Has appointmnet with a new therapist next  week Depression screen [facility] 2/9 09/08/2020 07/13/2019 12/01/2018  Decreased Interest 2 0 1  Down, Depressed, Hopeless 1 0 1  PHQ - 2 Score 3 0 2  Altered sleeping 3 - 1  Tired, decreased energy 1 - 1  Change in appetite 0 - 1  Feeling bad or failure about yourself  0 - 0  Trouble concentrating 0 - 0  Moving slowly or fidgety/restless 0 - 0  Suicidal thoughts 0 - 0  PHQ-9 Score 7 - 5  Difficult doing work/chores Somewhat difficult - -  Some recent data might be hidden      Outpatient Encounter Medications as of 09/08/2020  Medication Sig  . levocetirizine (XYZAL) 5 MG tablet Take 1 tablet (5 mg total) by mouth in the morning.  09/10/2020 levothyroxine (SYNTHROID) 50 MCG tablet TAKE 1 TABLET BY MOUTH EVERY DAY  . lisdexamfetamine (VYVANSE) 40 MG capsule Take 1 capsule (40 mg total) by mouth daily before breakfast.  . lisdexamfetamine (VYVANSE) 40 MG capsule Take 1 capsule (40 mg total) by mouth every morning.  . lisdexamfetamine (VYVANSE) 40 MG capsule Take 1 capsule (40 mg total) by mouth every morning.  . montelukast (SINGULAIR) 10 MG tablet Take 1 tablet (10 mg total) by mouth at bedtime. (Patient not taking: Reported on 02/12/2020)   No facility-administered encounter medications on file as of 09/08/2020.    Past Surgical History:  Procedure Laterality Date  . polyp removed as child      Family History  Problem Relation Age of Onset  . Diabetes Mother   . Heart disease Mother   . Cervical cancer Mother   .  Allergic rhinitis Father   . Breast cancer Paternal Grandmother   . Angioedema Neg Hx   . Asthma Neg Hx   . Atopy Neg Hx   . Urticaria Neg Hx     New complaints: Patient is c/o cough and congestion with sore throat. This just started last night. deneis fever. She had both covid vaccines.  Social history: Lives by herself  Controlled substance contract: needs new contract when comes in     Review of Systems  Constitutional: Negative for chills, diaphoresis, fever and  weight loss.  HENT: Positive for congestion and sore throat. Negative for sinus pain.   Eyes: Negative for blurred vision, double vision and pain.  Respiratory: Positive for cough (slight). Negative for shortness of breath.   Cardiovascular: Negative for chest pain, palpitations, orthopnea and leg swelling.  Gastrointestinal: Negative for abdominal pain.  Skin: Negative for rash.  Neurological: Negative for dizziness, sensory change, loss of consciousness, weakness and headaches.  Endo/Heme/Allergies: Negative for polydipsia. Does not bruise/bleed easily.  Psychiatric/Behavioral: Negative for memory loss. The patient does not have insomnia.   All other systems reviewed and are negative.    Observations/Objective: Alert and oriented- answers all questions appropriately No distress Voice hoarse Slight cough noted Voice sounds naslly  Assessment and Plan: Shelley Whitehead in today with chief complaint of Medical Management of Chronic Issues   1. Attention deficit disorder (ADD) without hyperactivity Stress management - lisdexamfetamine (VYVANSE) 40 MG capsule; Take 1 capsule (40 mg total) by mouth daily before breakfast.  Dispense: 30 capsule; Refill: 0 - lisdexamfetamine (VYVANSE) 40 MG capsule; Take 1 capsule (40 mg total) by mouth every morning.  Dispense: 30 capsule; Refill: 0 - lisdexamfetamine (VYVANSE) 40 MG capsule; Take 1 capsule (40 mg total) by mouth every morning.  Dispense: 30 capsule; Refill: 0  2. Panic attacks  3. Acquired hypothyroidism Patient will comein for next appointment to get labs drawn - levothyroxine (SYNTHROID) 50 MCG tablet; Take 1 tablet (50 mcg total) by mouth daily.  Dispense: 90 tablet; Refill: 1  4. Severe episode of recurrent major depressive disorder, without psychotic features [facility]) Stress management 'patient told to keep appointment with new therapist next week.  If changes he rmind and wants to start meds she will let me know.  5. Acute  nasopharyngitis Patient is going to be covid tested 1. Take meds as prescribed 2. Use a cool mist humidifier especially during the winter months and when heat has been humid. 3. Use saline nose sprays frequently 4. Saline irrigations of the nose can be very helpful if done frequently.  * 4X daily for 1 week*  * Use of a nettie pot can be helpful with this. Follow directions with this* 5. Drink plenty of fluids 6. Keep thermostat turn down low 7.For any cough or congestion  Use plain Mucinex- regular strength or max strength is fine   * Children- consult with Pharmacist for dosing 8. For fever or aces or pains- take tylenol or ibuprofen appropriate for age and weight.  * for fevers greater than 101 orally you may alternate ibuprofen and tylenol every  3 hours.         Follow Up Instructions: 3 months    I discussed the assessment and treatment plan with the patient. The patient was provided an opportunity to ask questions and all were answered. The patient agreed with the plan and demonstrated an understanding of the instructions.   The patient was advised to call back or seek an  in-person evaluation if the symptoms worsen or if the condition fails to improve as anticipated.  The above assessment and management plan was discussed with the patient. The patient verbalized understanding of and has agreed to the management plan. Patient is aware to call the clinic if symptoms persist or worsen. Patient is aware when to return to the clinic for a follow-up visit. Patient educated on when it is appropriate to go to the emergency department.   Time call ended:  2:43  I provided 23 minutes of non-face-to-face time during this encounter.    Mary-Margaret Daphine Deutscher, FNP

## 2020-12-01 ENCOUNTER — Other Ambulatory Visit: Payer: Self-pay

## 2020-12-01 ENCOUNTER — Ambulatory Visit (INDEPENDENT_AMBULATORY_CARE_PROVIDER_SITE_OTHER): Payer: BC Managed Care – PPO | Admitting: Obstetrics and Gynecology

## 2020-12-01 ENCOUNTER — Encounter: Payer: Self-pay | Admitting: Obstetrics and Gynecology

## 2020-12-01 VITALS — BP 130/76 | HR 82 | Resp 20 | Ht 61.5 in | Wt 226.8 lb

## 2020-12-01 DIAGNOSIS — Z01419 Encounter for gynecological examination (general) (routine) without abnormal findings: Secondary | ICD-10-CM | POA: Diagnosis not present

## 2020-12-01 DIAGNOSIS — Z6841 Body Mass Index (BMI) 40.0 and over, adult: Secondary | ICD-10-CM

## 2020-12-01 DIAGNOSIS — Z113 Encounter for screening for infections with a predominantly sexual mode of transmission: Secondary | ICD-10-CM

## 2020-12-01 DIAGNOSIS — Z833 Family history of diabetes mellitus: Secondary | ICD-10-CM | POA: Diagnosis not present

## 2020-12-01 DIAGNOSIS — Z0000 Encounter for general adult medical examination without abnormal findings: Secondary | ICD-10-CM | POA: Diagnosis not present

## 2020-12-01 DIAGNOSIS — E039 Hypothyroidism, unspecified: Secondary | ICD-10-CM

## 2020-12-01 NOTE — Patient Instructions (Signed)

## 2020-12-01 NOTE — Progress Notes (Signed)
30 y.o. G0P0000 Single White or Caucasian Not Hispanic or Latino female here for annual exam.  Sexually active in the last year, not currently.  Period Cycle (Days): 28 Period Duration (Days): 4 Period Pattern: Regular Menstrual Flow: Moderate Menstrual Control: Tampon,Maxi pad Menstrual Control Change Freq (Hours): 5 Dysmenorrhea: (!) Moderate Dysmenorrhea Symptoms: Cramping,Headache  Patient's last menstrual period was 11/01/2020 (approximate).          Sexually active: Yes.    The current method of family planning is condoms.    Exercising: Yes.    zumba and walking Smoker:  no  Health Maintenance: Pap:  11-23-2018 negative  History of abnormal Pap:  no TDaP:  11-23-2018 Gardasil: completed all 3   reports that she has never smoked. She has never used smokeless tobacco. She reports current alcohol use of about 2.0 - 3.0 standard drinks of alcohol per week. She reports current drug use. Drug: Marijuana. Elementary school teacher, 3rd grade.   Past Medical History:  Diagnosis Date  . ADD (attention deficit disorder)   . Anxiety   . Dysmenorrhea   . Thyroid disease    hypothyroid    Past Surgical History:  Procedure Laterality Date  . polyp removed as child      Current Outpatient Medications  Medication Sig Dispense Refill  . levocetirizine (XYZAL) 5 MG tablet Take 5 mg by mouth every evening.    Marland Kitchen levothyroxine (SYNTHROID) 50 MCG tablet Take 1 tablet (50 mcg total) by mouth daily. 90 tablet 1  . lisdexamfetamine (VYVANSE) 40 MG capsule Take 1 capsule (40 mg total) by mouth daily before breakfast. 30 capsule 0   No current facility-administered medications for this visit.    Family History  Problem Relation Age of Onset  . Diabetes Mother   . Heart disease Mother   . Cervical cancer Mother   . Allergic rhinitis Father   . Breast cancer Paternal Grandmother   . Angioedema Neg Hx   . Asthma Neg Hx   . Atopy Neg Hx   . Urticaria Neg Hx     Review of Systems   All other systems reviewed and are negative.   Exam:   BP 130/76   Pulse 82   Resp 20   Ht 5' 1.5" (1.562 m)   Wt 226 lb 12.8 oz (102.9 kg)   LMP 11/01/2020 (Approximate)   BMI 42.16 kg/m   Weight change: @WEIGHTCHANGE @ Height:   Height: 5' 1.5" (156.2 cm)  Ht Readings from Last 3 Encounters:  12/01/20 5' 1.5" (1.562 m)  01/01/20 5\' 2"  (1.575 m)  11/28/19 5' 1.5" (1.562 m)    General appearance: alert, cooperative and appears stated age Head: Normocephalic, without obvious abnormality, atraumatic Neck: no adenopathy, supple, symmetrical, trachea midline and thyroid normal to inspection and palpation Lungs: clear to auscultation bilaterally Cardiovascular: regular rate and rhythm Breasts: normal appearance, no masses or tenderness Abdomen: soft, non-tender; non distended,  no masses,  no organomegaly Extremities: extremities normal, atraumatic, no cyanosis or edema Skin: Skin color, texture, turgor normal. No rashes or lesions Lymph nodes: Cervical, supraclavicular, and axillary nodes normal. No abnormal inguinal nodes palpated Neurologic: Grossly normal   Pelvic: External genitalia:  no lesions              Urethra:  normal appearing urethra with no masses, tenderness or lesions              Bartholins and Skenes: normal  Vagina: normal appearing vagina with normal color and discharge, no lesions              Cervix: no lesions               Bimanual Exam:  Uterus:  normal size, contour, position, consistency, mobility, non-tender and anteverted              Adnexa: no mass, fullness, tenderness               Rectovaginal: Confirms               Anus:  normal sphincter tone, no lesions  Francene Finders chaperoned for the exam.  1. Well woman exam Discussed breast self exam Discussed calcium and vit D intake No pap this year  2. Screening examination for STD (sexually transmitted disease) Nuswab for GC/CT/Trich - HIV Antibody (routine testing w  rflx) - RPR  3. Laboratory exam ordered as part of routine general medical examination  - CBC - Comprehensive metabolic panel - Lipid panel  4. Family history of diabetes mellitus (DM)  - Hemoglobin A1c  5. Acquired hypothyroidism  - TSH  6. BMI 40.0-44.9, adult (HCC) Working on exercise - Hemoglobin A1c - TSH - Lipid panel   Not currently sexually active, she will call if she wants to restart OCP's.

## 2020-12-02 LAB — COMPREHENSIVE METABOLIC PANEL
AG Ratio: 1.3 (calc) (ref 1.0–2.5)
ALT: 12 U/L (ref 6–29)
AST: 17 U/L (ref 10–30)
Albumin: 4.1 g/dL (ref 3.6–5.1)
Alkaline phosphatase (APISO): 66 U/L (ref 31–125)
BUN: 16 mg/dL (ref 7–25)
CO2: 20 mmol/L (ref 20–32)
Calcium: 9.3 mg/dL (ref 8.6–10.2)
Chloride: 106 mmol/L (ref 98–110)
Creat: 0.58 mg/dL (ref 0.50–1.10)
Globulin: 3.2 g/dL (calc) (ref 1.9–3.7)
Glucose, Bld: 79 mg/dL (ref 65–99)
Potassium: 4 mmol/L (ref 3.5–5.3)
Sodium: 137 mmol/L (ref 135–146)
Total Bilirubin: 0.4 mg/dL (ref 0.2–1.2)
Total Protein: 7.3 g/dL (ref 6.1–8.1)

## 2020-12-02 LAB — HEMOGLOBIN A1C
Hgb A1c MFr Bld: 5.2 % of total Hgb (ref <5.7)
Mean Plasma Glucose: 103 mg/dL
eAG (mmol/L): 5.7 mmol/L

## 2020-12-02 LAB — CBC
HCT: 36.7 % (ref 35.0–45.0)
Hemoglobin: 12.3 g/dL (ref 11.7–15.5)
MCH: 29 pg (ref 27.0–33.0)
MCHC: 33.5 g/dL (ref 32.0–36.0)
MCV: 86.6 fL (ref 80.0–100.0)
MPV: 12.2 fL (ref 7.5–12.5)
Platelets: 257 10*3/uL (ref 140–400)
RBC: 4.24 10*6/uL (ref 3.80–5.10)
RDW: 13 % (ref 11.0–15.0)
WBC: 8.3 10*3/uL (ref 3.8–10.8)

## 2020-12-02 LAB — LIPID PANEL
Cholesterol: 151 mg/dL (ref <200)
HDL: 54 mg/dL (ref >=50)
LDL Cholesterol (Calc): 83 mg/dL (calc)
Non-HDL Cholesterol (Calc): 97 mg/dL (calc) (ref <130)
Total CHOL/HDL Ratio: 2.8 (calc) (ref <5.0)
Triglycerides: 68 mg/dL (ref <150)

## 2020-12-02 LAB — RPR: RPR Ser Ql: NONREACTIVE

## 2020-12-02 LAB — TSH: TSH: 5.23 mIU/L — ABNORMAL HIGH

## 2020-12-02 LAB — HIV ANTIBODY (ROUTINE TESTING W REFLEX): HIV 1&2 Ab, 4th Generation: NONREACTIVE

## 2020-12-03 LAB — SURESWAB CT/NG/T. VAGINALIS
C. trachomatis RNA, TMA: NOT DETECTED
N. gonorrhoeae RNA, TMA: NOT DETECTED
Trichomonas vaginalis RNA: NOT DETECTED

## 2021-02-11 ENCOUNTER — Encounter: Payer: Self-pay | Admitting: Nurse Practitioner

## 2021-02-11 ENCOUNTER — Ambulatory Visit (INDEPENDENT_AMBULATORY_CARE_PROVIDER_SITE_OTHER): Payer: BC Managed Care – PPO | Admitting: Nurse Practitioner

## 2021-02-11 DIAGNOSIS — F988 Other specified behavioral and emotional disorders with onset usually occurring in childhood and adolescence: Secondary | ICD-10-CM

## 2021-02-11 MED ORDER — LISDEXAMFETAMINE DIMESYLATE 40 MG PO CAPS: 40.0000 mg | ORAL_CAPSULE | Freq: Every day | ORAL | 0 refills | Status: DC

## 2021-02-11 NOTE — Progress Notes (Signed)
Virtual Visit  Note Due to COVID-19 pandemic this visit was conducted virtually. This visit type was conducted due to national recommendations for restrictions regarding the COVID-19 Pandemic (e.g. social distancing, sheltering in place) in an effort to limit this patient's exposure and mitigate transmission in our community. All issues noted in this document were discussed and addressed.  A physical exam was not performed with this format.  I connected with Shelley Whitehead on 02/11/21 at 12:08 by telephone and verified that I am speaking with the correct person using two identifiers. Shelley Whitehead is currently located at airport in [place] and no one is currently with her during visit. The provider, Mary-Margaret Daphine Deutscher, FNP is located in their office at time of visit.  I discussed the limitations, risks, security and privacy concerns of performing an evaluation and management service by telephone and the availability of in person appointments. I also discussed with the patient that there may be a patient responsible charge related to this service. The patient expressed understanding and agreed to proceed.   History and Present Illness:   Chief Complaint: ADD  HPI Patient appointment was scheduled to come in but she is stuck in airport in [place] and cannot make appointment. Patient is currently on vyvanse 40mg  and is doing well. She is not able to concentrate at work without medication. She is having no side effects.     Review of Systems  Constitutional: Negative for diaphoresis and weight loss.  Eyes: Negative for blurred vision, double vision and pain.  Respiratory: Negative for shortness of breath.   Cardiovascular: Negative for chest pain, palpitations, orthopnea and leg swelling.  Gastrointestinal: Negative for abdominal pain.  Skin: Negative for rash.  Neurological: Negative for dizziness, sensory change, loss of consciousness, weakness and headaches.  Endo/Heme/Allergies:  Negative for polydipsia. Does not bruise/bleed easily.  Psychiatric/Behavioral: Negative for memory loss. The patient does not have insomnia.   All other systems reviewed and are negative.    Observations/Objective: Alert and oriented- answers all questions appropriately No distress    Assessment and Plan: in today with chief complaint of No chief complaint on file.   1. Attention deficit disorder (ADD) without hyperactivity Meds ordered this encounter  Medications  . lisdexamfetamine (VYVANSE) 40 MG capsule    Sig: Take 1 capsule (40 mg total) by mouth daily before breakfast.    Dispense:  30 capsule    Refill:  0    Order Specific Question:   Supervising Provider    Answer:   Shelley Whitehead [facility]  . lisdexamfetamine (VYVANSE) 40 MG capsule    Sig: Take 1 capsule (40 mg total) by mouth daily before breakfast.    Dispense:  30 capsule    Refill:  0    Order Specific Question:   Supervising Provider    Answer:   [id number] A [facility]  . lisdexamfetamine (VYVANSE) 40 MG capsule    Sig: Take 1 capsule (40 mg total) by mouth daily before breakfast.    Dispense:  30 capsule    Refill:  0    Order Specific Question:   Supervising Provider    Answer:   [id number] Nils Pyle   Stress management     Follow Up Instructions: 3 months    I discussed the assessment and treatment plan with the patient. The patient was provided an opportunity to ask questions and all were answered. The patient agreed with the plan and demonstrated an understanding of the instructions.  The patient was advised to call back or seek an in-person evaluation if the symptoms worsen or if the condition fails to improve as anticipated.  The above assessment and management plan was discussed with the patient. The patient verbalized understanding of and has agreed to the management plan. Patient is aware to call the clinic if symptoms persist or worsen. Patient is  aware when to return to the clinic for a follow-up visit. Patient educated on when it is appropriate to go to the emergency department.   Time call ended:  12:21  I provided 13 minutes of  non face-to-face time during this encounter.    Mary-Margaret Daphine Deutscher, FNP

## 2021-02-23 ENCOUNTER — Telehealth: Payer: Self-pay

## 2021-02-23 NOTE — Telephone Encounter (Signed)
REFERRAL REQUEST Telephone Note  Have you been seen at our office for this problem?  (Advise that they may need an appointment with their PCP before a referral can be done)  Reason for Referral:  Referral discussed with patient:  Best contact number of patient for referral team:    Has patient been seen by a specialist for this issue before:  Patient provider preference for referral:  Patient location preference for referral:    Patient notified that referrals can take up to a week or longer to process. If they haven't heard anything within a week they should call back and speak with the referral department.

## 2021-02-23 NOTE — Telephone Encounter (Signed)
Patient needs appt

## 2021-02-24 ENCOUNTER — Telehealth (INDEPENDENT_AMBULATORY_CARE_PROVIDER_SITE_OTHER): Payer: BC Managed Care – PPO | Admitting: Nurse Practitioner

## 2021-02-24 ENCOUNTER — Encounter: Payer: Self-pay | Admitting: Nurse Practitioner

## 2021-02-24 DIAGNOSIS — R42 Dizziness and giddiness: Secondary | ICD-10-CM | POA: Diagnosis not present

## 2021-02-24 DIAGNOSIS — H938X3 Other specified disorders of ear, bilateral: Secondary | ICD-10-CM | POA: Diagnosis not present

## 2021-02-24 MED ORDER — MECLIZINE HCL 25 MG PO TABS: 25.0000 mg | ORAL_TABLET | Freq: Three times a day (TID) | ORAL | 0 refills | Status: DC | PRN

## 2021-02-24 MED ORDER — FLUTICASONE PROPIONATE 50 MCG/ACT NA SUSP: 2.0000 | Freq: Every day | NASAL | 6 refills | Status: DC

## 2021-02-24 NOTE — Progress Notes (Signed)
   Virtual Visit  Note Due to COVID-19 pandemic this visit was conducted virtually. This visit type was conducted due to national recommendations for restrictions regarding the COVID-19 Pandemic (e.g. social distancing, sheltering in place) in an effort to limit this patient's exposure and mitigate transmission in our community. All issues noted in this document were discussed and addressed.  A physical exam was not performed with this format. 9:23 I connected with Shelley Whitehead on 02/24/21 at 9:23 by telephone and verified that I am speaking with the correct person using two identifiers. Shelley Whitehead is currently located at work and  No one is currently with her during visit. The provider, Shelley Daphine Deutscher, FNP is located in their office at time of visit.  I discussed the limitations, risks, security and privacy concerns of performing an evaluation and management service by telephone and the availability of in person appointments. I also discussed with the patient that there may be a patient responsible charge related to this service. The patient expressed understanding and agreed to proceed.   History and Present Illness:   Chief Complaint: Otalgia   HPI Patient calls in c/o both ears being stopped up. They have been like this for several weeks after a sinus infection. She denies any ear drainage, but is having trouble hearing. Ears want to pop a lot. She would like to see ENT.   Review of Systems  Constitutional: Negative for chills and fever.  HENT: Positive for congestion and ear pain. Negative for ear discharge and sore throat.   Respiratory: Negative.   Musculoskeletal: Negative.   Neurological: Positive for dizziness.  Psychiatric/Behavioral: Negative.   All other systems reviewed and are negative.    Observations/Objective: Alert and oriented- answers all questions appropriately No distress Ears stopped up bil.   Assessment and Plan: Shelley Whitehead in today with  chief complaint of Otalgia   1. Sensation of fullness in both ears flonase nasal spray daily Continue xyzal daily - Ambulatory referral to ENT  2. Vertigo Force fluids - meclizine (ANTIVERT) 25 MG tablet; Take 1 tablet (25 mg total) by mouth 3 (three) times daily as needed for dizziness.  Dispense: 30 tablet; Refill: 0     Follow Up Instructions: prn    I discussed the assessment and treatment plan with the patient. The patient was provided an opportunity to ask questions and all were answered. The patient agreed with the plan and demonstrated an understanding of the instructions.   The patient was advised to call back or seek an in-person evaluation if the symptoms worsen or if the condition fails to improve as anticipated.  The above assessment and management plan was discussed with the patient. The patient verbalized understanding of and has agreed to the management plan. Patient is aware to call the clinic if symptoms persist or worsen. Patient is aware when to return to the clinic for a follow-up visit. Patient educated on when it is appropriate to go to the emergency department.   Time call ended:  9:36  I provided 12 minutes of  non face-to-face time during this encounter.    Shelley Daphine Deutscher, FNP

## 2021-03-24 ENCOUNTER — Other Ambulatory Visit: Payer: Self-pay | Admitting: Nurse Practitioner

## 2021-03-24 DIAGNOSIS — E039 Hypothyroidism, unspecified: Secondary | ICD-10-CM

## 2021-05-06 ENCOUNTER — Other Ambulatory Visit: Payer: Self-pay

## 2021-05-06 ENCOUNTER — Ambulatory Visit: Payer: BC Managed Care – PPO | Admitting: Nurse Practitioner

## 2021-05-06 ENCOUNTER — Encounter: Payer: Self-pay | Admitting: Nurse Practitioner

## 2021-05-06 VITALS — BP 117/65 | HR 71 | Temp 98.1°F | Ht 65.1 in | Wt 222.0 lb

## 2021-05-06 DIAGNOSIS — R591 Generalized enlarged lymph nodes: Secondary | ICD-10-CM | POA: Insufficient documentation

## 2021-05-06 DIAGNOSIS — G4489 Other headache syndrome: Secondary | ICD-10-CM

## 2021-05-06 MED ORDER — ACETAMINOPHEN 500 MG PO TABS
500.0000 mg | ORAL_TABLET | Freq: Four times a day (QID) | ORAL | 0 refills | Status: DC | PRN
Start: 2021-05-06 — End: 2021-06-02

## 2021-05-06 NOTE — Patient Instructions (Signed)
Lymphadenopathy °Lymphadenopathy means that your lymph glands are swollen or larger than normal. Lymph glands, also called lymph nodes, are collections of tissue that filter excess fluid, bacteria, viruses, and waste from your bloodstream. They are part of your body's disease-fighting system (immune system), which protects your body from germs. °There may be different causes of lymphadenopathy, depending on where it is in your body. Some types go away on their own. Lymphadenopathy can occur anywhere that you have lymph glands, including these areas: °Neck (cervical lymphadenopathy). °Chest (mediastinal lymphadenopathy). °Lungs (hilar lymphadenopathy). °Underarms (axillary lymphadenopathy). °Groin (inguinal lymphadenopathy). °When your immune system responds to germs, infection-fighting cells and fluid build up in your lymph glands. This causes some swelling and enlargement. If the lymph nodes do not go back to normal size after you have an infection or disease, your health care provider may do tests. These tests help to monitor your condition and find the reason why the glands are still swollen and enlarged. °Follow these instructions at home: ° °Get plenty of rest. °Your health care provider may recommend over-the-counter medicines for pain. Take over-the-counter and prescription medicines only as told by your health care provider. °If directed, apply heat to swollen lymph glands as often as told by your health care provider. Use the heat source that your health care provider recommends, such as a moist heat pack or a heating pad. °Place a towel between your skin and the heat source. °Leave the heat on for 20-30 minutes. °Remove the heat if your skin turns bright red. This is especially important if you are unable to feel pain, heat, or cold. You may have a greater risk of getting burned. °Check your affected lymph glands every day for changes. Check other lymph gland areas as told by your health care provider.  Check for changes such as: °More swelling. °Sudden increase in size. °Redness or pain. °Hardness. °Keep all follow-up visits. This is important. °Contact a health care provider if you have: °Lymph glands that: °Are still swollen after 2 weeks. °Have suddenly gotten bigger or the swelling spreads. °Are red, painful, or hard. °Fluid leaking from the skin near an enlarged lymph gland. °Problems with breathing. °A fever, chills, or night sweats. °Fatigue. °A sore throat. °Pain in your abdomen. °Weight loss. °Get help right away if you have: °Severe pain. °Chest pain. °Shortness of breath. °These symptoms may represent a serious problem that is an emergency. Do not wait to see if the symptoms will go away. Get medical help right away. Call your local emergency services (911 in the [place] Do not drive yourself to the hospital. °Summary °Lymphadenopathy means that your lymph glands are swollen or larger than normal. °Lymph glands, also called lymph nodes, are collections of tissue that filter excess fluid, bacteria, viruses, and waste from the bloodstream. They are part of your body's disease-fighting system (immune system). °Lymphadenopathy can occur anywhere that you have lymph glands. °If the lymph nodes do not go back to normal size after you have an infection or disease, your health care provider may do tests to monitor your condition and find the reason why the glands are still swollen and enlarged. °Check your affected lymph glands every day for changes. Check other lymph gland areas as told by your health care provider. °This information is not intended to replace advice given to you by your health care provider. Make sure you discuss any questions you have with your health care provider. °Document Revised: 08/06/2020 Document Reviewed: 08/06/2020 °Elsevier Patient Education © 2022   [employer] ° °

## 2021-05-06 NOTE — Assessment & Plan Note (Signed)
New lymphadenopathy left back neck.  Symptoms includes tenderness, palpable nodule.  Will observe to see changes.  CBC completed, follow-up with unresolved symptoms in the next 3 to 6 months.

## 2021-05-06 NOTE — Progress Notes (Signed)
Acute Office Visit  Subjective:    Patient ID: Shelley Whitehead, female    DOB: Apr 25, 1991, 30 y.o.   MRN: [id number]  Chief Complaint  Patient presents with  . lump in neck  . Headache    Headache  This is a new problem. The current episode started in the past 7 days. The problem occurs intermittently. The problem has been unchanged. The pain is located in the Bilateral region. The pain quality is not similar to prior headaches. The quality of the pain is described as aching and dull. The pain is moderate. Pertinent negatives include no abdominal pain, blurred vision, fever, hearing loss or sinus pressure. Nothing aggravates the symptoms. She has tried acetaminophen for the symptoms. The treatment provided mild relief.    Past Medical History:  Diagnosis Date  . ADD (attention deficit disorder)   . Anxiety   . Dysmenorrhea   . Thyroid disease    hypothyroid    Past Surgical History:  Procedure Laterality Date  . polyp removed as child      Family History  Problem Relation Age of Onset  . Diabetes Mother   . Heart disease Mother   . Cervical cancer Mother   . Allergic rhinitis Father   . Breast cancer Paternal Grandmother   . Angioedema Neg Hx   . Asthma Neg Hx   . Atopy Neg Hx   . Urticaria Neg Hx     Social History   Socioeconomic History  . Marital status: Single    Spouse name: Not on file  . Number of children: Not on file  . Years of education: Not on file  . Highest education level: Not on file  Occupational History  . Not on file  Tobacco Use  . Smoking status: Never  . Smokeless tobacco: Never  Vaping Use  . Vaping Use: Never used  Substance and Sexual Activity  . Alcohol use: Yes    Alcohol/week: 2.0 - 3.0 standard drinks    Types: 2 - 3 Standard drinks or equivalent per week  . Drug use: Yes    Types: Marijuana    Comment: seldom  . Sexual activity: Not Currently    Birth control/protection: Condom  Other Topics Concern  . Not on file   Social History Narrative  . Not on file   Social Determinants of Health   Financial Resource Strain: Not on file  Food Insecurity: Not on file  Transportation Needs: Not on file  Physical Activity: Not on file  Stress: Not on file  Social Connections: Not on file  Intimate Partner Violence: Not on file    Outpatient Medications Prior to Visit  Medication Sig Dispense Refill  . fluticasone (FLONASE) 50 MCG/ACT nasal spray Place 2 sprays into both nostrils daily. 16 g 6  . levocetirizine (XYZAL) 5 MG tablet Take 5 mg by mouth every evening.    Marland Kitchen levothyroxine (SYNTHROID) 50 MCG tablet TAKE 1 TABLET BY MOUTH EVERY DAY 90 tablet 1  . lisdexamfetamine (VYVANSE) 40 MG capsule Take 1 capsule (40 mg total) by mouth daily before breakfast. 30 capsule 0  . meclizine (ANTIVERT) 25 MG tablet Take 1 tablet (25 mg total) by mouth 3 (three) times daily as needed for dizziness. 30 tablet 0  . lisdexamfetamine (VYVANSE) 40 MG capsule Take 1 capsule (40 mg total) by mouth daily before breakfast. 30 capsule 0  . lisdexamfetamine (VYVANSE) 40 MG capsule Take 1 capsule (40 mg total) by mouth daily before breakfast. 30  capsule 0   No facility-administered medications prior to visit.    Allergies  Allergen Reactions  . Peanut-Containing Drug Products Swelling  . Soy Allergy Hives    Review of Systems  Constitutional:  Negative for fever.  HENT:  Negative for hearing loss and sinus pressure.   Eyes:  Negative for blurred vision.  Gastrointestinal:  Negative for abdominal pain.  Neurological:  Positive for headaches.  All other systems reviewed and are negative.     Objective:    Physical Exam Vitals and nursing note reviewed.  Constitutional:      Appearance: She is well-developed.  HENT:     Head: Normocephalic.     Nose: Nose normal.     Mouth/Throat:     Mouth: Mucous membranes are moist.     Pharynx: Oropharynx is clear.  Eyes:     Conjunctiva/sclera: Conjunctivae normal.  Neck:       Comments: Swollen lymph node  Cardiovascular:     Rate and Rhythm: Normal rate and regular rhythm.  Pulmonary:     Breath sounds: Normal breath sounds.  Abdominal:     General: Bowel sounds are normal.  Musculoskeletal:     Cervical back: Normal range of motion.  Lymphadenopathy:     Cervical: Cervical adenopathy present.  Skin:    General: Skin is warm.     Findings: No rash.  Neurological:     Mental Status: She is alert and oriented to person, place, and time.  Psychiatric:        Mood and Affect: Mood normal.        Behavior: Behavior normal.    BP 117/65   Pulse 71   Temp 98.1 F (36.7 C) (Temporal)   Ht 5' 5.1" (1.654 m)   Wt 222 lb (100.7 kg)   SpO2 98%   BMI 36.83 kg/m  Wt Readings from Last 3 Encounters:  05/06/21 222 lb (100.7 kg)  12/01/20 226 lb 12.8 oz (102.9 kg)  01/01/20 226 lb 9.6 oz (102.8 kg)    Health Maintenance Due  Topic Date Due  . COVID-19 Vaccine (1) Never done    There are no preventive care reminders to display for this patient.   Lab Results  Component Value Date   TSH 5.23 (H) 12/01/2020   Lab Results  Component Value Date   WBC 8.3 12/01/2020   HGB 12.3 12/01/2020   HCT 36.7 12/01/2020   MCV 86.6 12/01/2020   PLT 257 12/01/2020   Lab Results  Component Value Date   NA 137 12/01/2020   K 4.0 12/01/2020   CO2 20 12/01/2020   GLUCOSE 79 12/01/2020   BUN 16 12/01/2020   CREATININE 0.58 12/01/2020   BILITOT 0.4 12/01/2020   ALKPHOS 84 11/28/2019   AST 17 12/01/2020   ALT 12 12/01/2020   PROT 7.3 12/01/2020   ALBUMIN 4.6 11/28/2019   CALCIUM 9.3 12/01/2020   Lab Results  Component Value Date   CHOL 151 12/01/2020   Lab Results  Component Value Date   HDL 54 12/01/2020   Lab Results  Component Value Date   LDLCALC 83 12/01/2020   Lab Results  Component Value Date   TRIG 68 12/01/2020   Lab Results  Component Value Date   CHOLHDL 2.8 12/01/2020   Lab Results  Component Value Date   HGBA1C 5.2  12/01/2020       Assessment & Plan:   Problem List Items Addressed This Visit  Immune and Lymphatic   Lymphadenopathy - Primary    New lymphadenopathy left back neck.  Symptoms includes tenderness, palpable nodule.  Will observe to see changes.  CBC completed, follow-up with unresolved symptoms in the next 3 to 6 months.       Relevant Orders   CBC with Differential     Other   Other headache syndrome    Resolved headache, patient has a history of migraine not recorded in history, advised Tylenol as needed, increase hydration, stress management, follow-up with worsening or unresolved symptoms.       Relevant Medications   acetaminophen (TYLENOL) 500 MG tablet     Meds ordered this encounter  Medications  . acetaminophen (TYLENOL) 500 MG tablet    Sig: Take 1 tablet (500 mg total) by mouth every 6 (six) hours as needed.    Dispense:  30 tablet    Refill:  0    Order Specific Question:   Supervising Provider    Answer:   Raliegh Ip [[id number]]     Daryll Drown, NP

## 2021-05-06 NOTE — Assessment & Plan Note (Signed)
Resolved headache, patient has a history of migraine not recorded in history, advised Tylenol as needed, increase hydration, stress management, follow-up with worsening or unresolved symptoms.

## 2021-05-07 LAB — CBC WITH DIFFERENTIAL/PLATELET
Basophils Absolute: 0 10*3/uL (ref 0.0–0.2)
Basos: 0 %
EOS (ABSOLUTE): 0.1 10*3/uL (ref 0.0–0.4)
Eos: 1 %
Hematocrit: 37.1 % (ref 34.0–46.6)
Hemoglobin: 12.7 g/dL (ref 11.1–15.9)
Immature Grans (Abs): 0 10*3/uL (ref 0.0–0.1)
Immature Granulocytes: 0 %
Lymphocytes Absolute: 2.2 10*3/uL (ref 0.7–3.1)
Lymphs: 22 %
MCH: 30.2 pg (ref 26.6–33.0)
MCHC: 34.2 g/dL (ref 31.5–35.7)
MCV: 88 fL (ref 79–97)
Monocytes Absolute: 0.7 10*3/uL (ref 0.1–0.9)
Monocytes: 7 %
Neutrophils Absolute: 6.9 10*3/uL (ref 1.4–7.0)
Neutrophils: 70 %
Platelets: 260 10*3/uL (ref 150–450)
RBC: 4.2 x10E6/uL (ref 3.77–5.28)
RDW: 13.3 % (ref 11.7–15.4)
WBC: 10 10*3/uL (ref 3.4–10.8)

## 2021-05-08 ENCOUNTER — Other Ambulatory Visit: Payer: Self-pay | Admitting: Nurse Practitioner

## 2021-05-08 ENCOUNTER — Telehealth: Payer: Self-pay | Admitting: Nurse Practitioner

## 2021-05-08 DIAGNOSIS — R591 Generalized enlarged lymph nodes: Secondary | ICD-10-CM

## 2021-05-08 NOTE — Telephone Encounter (Signed)
Patient questioning WCB aware it was normal, but states the lump is no better been there for over a month she would like to ga ahead and get a scan for it. Please advise. Seen you 07/13. Please advise

## 2021-05-08 NOTE — Progress Notes (Unsigned)
Ultrasound neck

## 2021-05-08 NOTE — Telephone Encounter (Signed)
Left detailed message.   

## 2021-05-08 NOTE — Telephone Encounter (Signed)
Pt aware.

## 2021-05-11 ENCOUNTER — Telehealth: Payer: Self-pay | Admitting: Nurse Practitioner

## 2021-05-18 ENCOUNTER — Other Ambulatory Visit: Payer: Self-pay

## 2021-05-18 ENCOUNTER — Ambulatory Visit (HOSPITAL_COMMUNITY)
Admission: RE | Admit: 2021-05-18 | Discharge: 2021-05-18 | Disposition: A | Discharge status: Home / Self Care | Payer: BC Managed Care – PPO | Source: Ambulatory Visit | Attending: Nurse Practitioner | Admitting: Nurse Practitioner

## 2021-05-18 DIAGNOSIS — R591 Generalized enlarged lymph nodes: Secondary | ICD-10-CM | POA: Diagnosis present

## 2021-06-02 ENCOUNTER — Ambulatory Visit: Payer: BC Managed Care – PPO | Admitting: Nurse Practitioner

## 2021-06-02 ENCOUNTER — Other Ambulatory Visit: Payer: Self-pay

## 2021-06-02 ENCOUNTER — Encounter: Payer: Self-pay | Admitting: Nurse Practitioner

## 2021-06-02 ENCOUNTER — Ambulatory Visit (INDEPENDENT_AMBULATORY_CARE_PROVIDER_SITE_OTHER): Payer: BC Managed Care – PPO

## 2021-06-02 VITALS — BP 129/78 | HR 73 | Temp 98.5°F | Resp 20 | Ht 65.0 in | Wt 222.0 lb

## 2021-06-02 DIAGNOSIS — R1032 Left lower quadrant pain: Secondary | ICD-10-CM

## 2021-06-02 DIAGNOSIS — E039 Hypothyroidism, unspecified: Secondary | ICD-10-CM | POA: Diagnosis not present

## 2021-06-02 DIAGNOSIS — F988 Other specified behavioral and emotional disorders with onset usually occurring in childhood and adolescence: Secondary | ICD-10-CM

## 2021-06-02 DIAGNOSIS — K5901 Slow transit constipation: Secondary | ICD-10-CM

## 2021-06-02 DIAGNOSIS — R5081 Fever presenting with conditions classified elsewhere: Secondary | ICD-10-CM

## 2021-06-02 DIAGNOSIS — R519 Headache, unspecified: Secondary | ICD-10-CM

## 2021-06-02 MED ORDER — LISDEXAMFETAMINE DIMESYLATE 40 MG PO CAPS: 40.0000 mg | ORAL_CAPSULE | Freq: Every day | ORAL | 0 refills | Status: DC

## 2021-06-02 NOTE — Progress Notes (Signed)
Subjective:    Patient ID: Shelley Whitehead, female    DOB: 06-Dec-1990, 30 y.o.   MRN: [id number]   Chief Complaint: shooting pains in head, stomach cramps, and low grade fever   HPI Patient comes in today c/o low grade fever, stomach ache and shooting pains in head. This all started on June 15, she noticed a knot on left side of neck. Came in and saw J and a u/s was done which was showed enlarged lymph node. Sh ealso developed headaches at the same time. They are headaches that feel like she is being stabled with an ice pick. Mainly on left side where lymph node is. The knot in neck in neck is still there an unchanged and intermittent head pais are still going on. Then this past Friday she developed a cramping sensation on left side of abdomen. Lasted all weekend. The weekend she had pain in lower back and down back of both legs. That has since resolved.   Needs ADHD meds refilled- has been on vyvane for many years.. is not able to concentrate at work without meds.  Review of Systems  Constitutional:  Positive for fatigue and fever (low grade). Negative for chills.  HENT:  Positive for postnasal drip. Negative for congestion, rhinorrhea, sinus pain and sore throat.   Respiratory:  Negative for shortness of breath.   Cardiovascular:  Negative for chest pain and leg swelling.  Musculoskeletal:  Positive for back pain (has resolved).  Neurological:  Positive for headaches. Negative for dizziness, facial asymmetry and speech difficulty.  Psychiatric/Behavioral: Negative.    All other systems reviewed and are negative.     Objective:   Physical Exam Vitals and nursing note reviewed.  Constitutional:      Appearance: Normal appearance. She is obese.  Cardiovascular:     Rate and Rhythm: Normal rate and regular rhythm.     Heart sounds: Normal heart sounds.  Pulmonary:     Effort: Pulmonary effort is normal.     Breath sounds: Normal breath sounds.  Skin:    General: Skin is warm.   Neurological:     General: No focal deficit present.     Mental Status: She is alert and oriented to person, place, and time.  Psychiatric:        Mood and Affect: Mood normal.        Behavior: Behavior normal.    BP 129/78   Pulse 73   Temp 98.5 F (36.9 C) (Temporal)   Resp 20   Ht 5\' 5"  (1.651 m)   Wt 222 lb (100.7 kg)   SpO2 96%   BMI 36.94 kg/m  KUB- large amount of stool burden      Assessment & Plan:  Shelley Whitehead in today with chief complaint of shooting pains in head, stomach cramps, and low grade fever   1. Acute nonintractable headache, unspecified headache type Referral to head ache clinic - AMB referral to headache clinic  2. Fever in other diseases Labs pending - Epstein-Barr virus VCA antibody panel - CBC with Differential/Platelet  3. Acquired hypothyroidism Labs pending - Thyroid Panel With TSH  4. LLQ pain - DG Abd 1 View  5. Slow transit constipation Milk of Magnesia and prune juice Start miralax daily in apple jiuce once you have good results. Increase fiber in diet  6. adhd Meds ordered this encounter  Medications   lisdexamfetamine (VYVANSE) 40 MG capsule    Sig: Take 1 capsule (40 mg total) by mouth  daily before breakfast.    Dispense:  30 capsule    Refill:  0    Order Specific Question:   Supervising Provider    Answer:   Arville Care A [[id number]]   lisdexamfetamine (VYVANSE) 40 MG capsule    Sig: Take 1 capsule (40 mg total) by mouth daily before breakfast.    Dispense:  30 capsule    Refill:  0    Order Specific Question:   Supervising Provider    Answer:   Arville Care A [[id number]]   lisdexamfetamine (VYVANSE) 40 MG capsule    Sig: Take 1 capsule (40 mg total) by mouth daily before breakfast.    Dispense:  30 capsule    Refill:  0    Order Specific Question:   Supervising Provider    Answer:   Arville Care A [[id number]]       The above assessment and management plan was discussed with the patient. The  patient verbalized understanding of and has agreed to the management plan. Patient is aware to call the clinic if symptoms persist or worsen. Patient is aware when to return to the clinic for a follow-up visit. Patient educated on when it is appropriate to go to the emergency department.   Mary-Margaret Daphine Deutscher, FNP

## 2021-06-02 NOTE — Patient Instructions (Signed)

## 2021-06-03 LAB — CBC WITH DIFFERENTIAL/PLATELET
Basophils Absolute: 0 10*3/uL (ref 0.0–0.2)
Basos: 1 %
EOS (ABSOLUTE): 0.1 10*3/uL (ref 0.0–0.4)
Eos: 1 %
Hematocrit: 37.3 % (ref 34.0–46.6)
Hemoglobin: 12.3 g/dL (ref 11.1–15.9)
Immature Grans (Abs): 0 10*3/uL (ref 0.0–0.1)
Immature Granulocytes: 0 %
Lymphocytes Absolute: 2.6 10*3/uL (ref 0.7–3.1)
Lymphs: 30 %
MCH: 29.1 pg (ref 26.6–33.0)
MCHC: 33 g/dL (ref 31.5–35.7)
MCV: 88 fL (ref 79–97)
Monocytes Absolute: 0.7 10*3/uL (ref 0.1–0.9)
Monocytes: 8 %
Neutrophils Absolute: 5 10*3/uL (ref 1.4–7.0)
Neutrophils: 60 %
Platelets: 272 10*3/uL (ref 150–450)
RBC: 4.22 x10E6/uL (ref 3.77–5.28)
RDW: 12.8 % (ref 11.7–15.4)
WBC: 8.4 10*3/uL (ref 3.4–10.8)

## 2021-06-03 LAB — THYROID PANEL WITH TSH
Free Thyroxine Index: 2.5 (ref 1.2–4.9)
T3 Uptake Ratio: 27 % (ref 24–39)
T4, Total: 9.1 ug/dL (ref 4.5–12.0)
TSH: 4.46 u[IU]/mL (ref 0.450–4.500)

## 2021-06-03 LAB — EPSTEIN-BARR VIRUS (EBV) ANTIBODY PROFILE
EBV NA IgG: 230 U/mL — ABNORMAL HIGH (ref 0.0–17.9)
EBV VCA IgG: >600 U/mL — ABNORMAL HIGH (ref 0.0–17.9)
EBV VCA IgM: <36 U/mL (ref 0.0–35.9)

## 2021-07-06 ENCOUNTER — Other Ambulatory Visit: Payer: Self-pay

## 2021-07-06 ENCOUNTER — Encounter: Payer: Self-pay | Admitting: Obstetrics and Gynecology

## 2021-07-06 ENCOUNTER — Ambulatory Visit: Payer: BC Managed Care – PPO | Admitting: Obstetrics and Gynecology

## 2021-07-06 VITALS — BP 126/70 | HR 74 | Ht 62.0 in | Wt 223.0 lb

## 2021-07-06 DIAGNOSIS — N926 Irregular menstruation, unspecified: Secondary | ICD-10-CM | POA: Diagnosis not present

## 2021-07-06 DIAGNOSIS — L292 Pruritus vulvae: Secondary | ICD-10-CM

## 2021-07-06 DIAGNOSIS — R0989 Other specified symptoms and signs involving the circulatory and respiratory systems: Secondary | ICD-10-CM

## 2021-07-06 DIAGNOSIS — R109 Unspecified abdominal pain: Secondary | ICD-10-CM

## 2021-07-06 DIAGNOSIS — Z113 Encounter for screening for infections with a predominantly sexual mode of transmission: Secondary | ICD-10-CM

## 2021-07-06 DIAGNOSIS — R102 Pelvic and perineal pain: Secondary | ICD-10-CM

## 2021-07-06 LAB — WET PREP FOR TRICH, YEAST, CLUE

## 2021-07-06 LAB — PREGNANCY, URINE: Preg Test, Ur: NEGATIVE

## 2021-07-06 MED ORDER — BETAMETHASONE VALERATE 0.1 % EX OINT: 1.0000 "application " | TOPICAL_OINTMENT | Freq: Two times a day (BID) | CUTANEOUS | 0 refills | Status: DC

## 2021-07-06 NOTE — Progress Notes (Signed)
GYNECOLOGY  VISIT   HPI: 30 y.o.   Single White or Caucasian Not Hispanic or Latino  female   G0P0000 with No LMP recorded.   here for irregular bleeding. She has had 3 periods in the last month. 8/13-16, normal   8/28-31, flow was lighter than normal, needed a regular tampon.  9/5-9, seemed normal.  She has been a 28 days cycle for years. Her typical cycles last for 4 days with moderate flow and moderate dysmenorrhea.  She is hypothyroid, on synthroid. Last TSH was on 06/02/21 and was 4.46.   Sexually active, recent partner cheated.   She is having some vulvar itching.   About a month ago she was seen by her primary for LLQ abdominal pain. Primary did an X-Ray and told her she had constipation. She continues to have residual cramping on the left. Last night she had a stabbing pain under her umbilicus.  Currently BM's are 2 x a day, normal. No bladder c/o.  She has been diagnosed with a swollen lymph node in the back of her neck. Ultrasound is reassuring. She was told it should go away in a couple of months. It has been there since June, tender, not getting smaller.    GYNECOLOGIC HISTORY: No LMP recorded. Contraception: none  Menopausal hormone therapy: none         OB History     Gravida  0   Para  0   Term  0   Preterm  0   AB  0   Living  0      SAB  0   IAB  0   Ectopic  0   Multiple  0   Live Births  0              Patient Active Problem List   Diagnosis Date Noted   Other headache syndrome 05/06/2021   Lymphadenopathy 05/06/2021   Panic attacks 02/13/2019   Acquired hypothyroidism 12/01/2018   ADD (attention deficit disorder) 07/27/2013   Depression 01/14/2011    Past Medical History:  Diagnosis Date   ADD (attention deficit disorder)    Anxiety    Dysmenorrhea    Thyroid disease    hypothyroid    Past Surgical History:  Procedure Laterality Date   polyp removed as child      Current Outpatient Medications  Medication Sig  Dispense Refill   fluticasone (FLONASE) 50 MCG/ACT nasal spray Place 2 sprays into both nostrils daily. 16 g 6   levocetirizine (XYZAL) 5 MG tablet Take 5 mg by mouth every evening.     levothyroxine (SYNTHROID) 50 MCG tablet TAKE 1 TABLET BY MOUTH EVERY DAY 90 tablet 1   [START ON 08/01/2021] lisdexamfetamine (VYVANSE) 40 MG capsule Take 1 capsule (40 mg total) by mouth daily before breakfast. 30 capsule 0   lisdexamfetamine (VYVANSE) 40 MG capsule Take 1 capsule (40 mg total) by mouth daily before breakfast. 30 capsule 0   lisdexamfetamine (VYVANSE) 40 MG capsule Take 1 capsule (40 mg total) by mouth daily before breakfast. 30 capsule 0   meclizine (ANTIVERT) 25 MG tablet Take 1 tablet (25 mg total) by mouth 3 (three) times daily as needed for dizziness. 30 tablet 0   No current facility-administered medications for this visit.     ALLERGIES: Peanut-containing drug products and Soy allergy  Family History  Problem Relation Age of Onset   Diabetes Mother    Heart disease Mother    Cervical cancer Mother  Allergic rhinitis Father    Breast cancer Paternal Grandmother    Angioedema Neg Hx    Asthma Neg Hx    Atopy Neg Hx    Urticaria Neg Hx     Social History   Socioeconomic History   Marital status: Single    Spouse name: Not on file   Number of children: Not on file   Years of education: Not on file   Highest education level: Not on file  Occupational History   Not on file  Tobacco Use   Smoking status: Never   Smokeless tobacco: Never  Vaping Use   Vaping Use: Never used  Substance and Sexual Activity   Alcohol use: Yes    Alcohol/week: 2.0 - 3.0 standard drinks    Types: 2 - 3 Standard drinks or equivalent per week   Drug use: Yes    Types: Marijuana    Comment: seldom   Sexual activity: Not Currently    Birth control/protection: Condom  Other Topics Concern   Not on file  Social History Narrative   Not on file   Social Determinants of Health   Financial  Resource Strain: Not on file  Food Insecurity: Not on file  Transportation Needs: Not on file  Physical Activity: Not on file  Stress: Not on file  Social Connections: Not on file  Intimate Partner Violence: Not on file    Review of Systems  All other systems reviewed and are negative.  PHYSICAL EXAMINATION:    There were no vitals taken for this visit.    General appearance: alert, cooperative and appears stated age Neck: small, palpable posterior auricular lymph note on the left, mildly tender.  Abdomen: soft, non-tender; non distended, no masses,  no organomegaly  Pelvic: External genitalia:  whitening of the skin noted on the lower inner labia majora bilaterally              Urethra:  normal appearing urethra with no masses, tenderness or lesions              Bartholins and Skenes: normal                 Vagina: normal appearing vagina with normal color and discharge, no lesions              Cervix: no cervical motion tenderness and no lesions              Bimanual Exam:  Uterus:   no masses or tenderness              Adnexa: no mass, fullness, tenderness              Rectovaginal: Yes.  .  Confirms.              Anus:  normal sphincter tone, no lesions  Chaperone was present for exam.  1. Irregular menses She is under stress. Normal TSH Will calendar her cycles, if the irregular bleeding persists will further evaluate  2. Combined abdominal and pelvic pain Normal exam, if pain persists she will call and we will set up an ultrasound  3. Screening examination for STD (sexually transmitted disease) - RPR - HIV Antibody (routine testing w rflx) - Hepatitis C antibody - SURESWAB CT/NG/T. vaginalis  4. Vulvar pruritus - WET PREP FOR TRICH, YEAST, CLUE - betamethasone valerate ointment (VALISONE) 0.1 %; Apply 1 application topically 2 (two) times daily. User for up to 1-2 weeks as needed  Dispense: 30  g; Refill: 0  5. Tender lymph node Imaging was reassuring. She is  concerned because it persists and is tender.  She would like a second opinion, will arrange

## 2021-07-07 LAB — HEPATITIS C ANTIBODY
Hepatitis C Ab: NONREACTIVE
SIGNAL TO CUT-OFF: 0.02 (ref <1.00)

## 2021-07-07 LAB — HIV ANTIBODY (ROUTINE TESTING W REFLEX): HIV 1&2 Ab, 4th Generation: NONREACTIVE

## 2021-07-07 LAB — SURESWAB CT/NG/T. VAGINALIS
C. trachomatis RNA, TMA: NOT DETECTED
N. gonorrhoeae RNA, TMA: NOT DETECTED
Trichomonas vaginalis RNA: NOT DETECTED

## 2021-07-07 LAB — RPR: RPR Ser Ql: NONREACTIVE

## 2021-07-08 ENCOUNTER — Encounter: Payer: Self-pay | Admitting: Obstetrics and Gynecology

## 2021-07-09 ENCOUNTER — Ambulatory Visit: Payer: BC Managed Care – PPO | Admitting: Physician Assistant

## 2021-08-03 ENCOUNTER — Encounter: Payer: Self-pay | Admitting: Obstetrics and Gynecology

## 2021-08-04 MED ORDER — NORETHIN ACE-ETH ESTRAD-FE 1-20 MG-MCG PO TABS: 1.0000 | ORAL_TABLET | Freq: Every day | ORAL | 0 refills | Status: DC

## 2021-08-19 NOTE — Telephone Encounter (Signed)
I called patient and per DPR access note on file I left Dr. Salli Quarry My Chart message reply in her voice mail.

## 2021-08-21 ENCOUNTER — Other Ambulatory Visit: Payer: Self-pay | Admitting: Nurse Practitioner

## 2021-10-19 ENCOUNTER — Other Ambulatory Visit: Payer: Self-pay | Admitting: Obstetrics and Gynecology

## 2021-10-20 NOTE — Telephone Encounter (Signed)
Annual exam scheduled on 12/22/21

## 2021-11-23 NOTE — Progress Notes (Signed)
31 y.o. G0P0000 Single White or Caucasian Not Hispanic or Latino female here for annual exam. She is having random stabbing pain in her vagina and intermittent LLQ abdominal pain for several months. Some improvement with dietary changes. No bowel c/o or urinary c/o.  She has been off of OCP's for a few weeks, she just missed several pills. She started OCP's in 12/22 for cycle control and contraception. Feels likes she has been moody on the pill (loestrin 1/20). The moodiness was improving.  She is sexually active, same partner since she had STD testing, had one other partner.  Period Cycle (Days):  (She is unsure she does not track it) Period Duration (Days): 4 Menstrual Flow: Heavy Menstrual Control: Tampon, Thin pad Menstrual Control Change Freq (Hours): 4 Dysmenorrhea: (!) Severe Dysmenorrhea Symptoms: Cramping, Headache, Nausea  Patient's last menstrual period was 11/17/2021.          Sexually active: Yes.    The current method of family planning is coitus interruptus. Wants to restart her pills.    Exercising: Yes.    Home exercise routine includes Cardio . Smoker:  no  Health Maintenance: Pap:  11-23-2018 negative  History of abnormal Pap:  no MMG:  n/a BMD:   n/a Colonoscopy: n/a TDaP:  11/23/2018 Gardasil: complete x 3    reports that she has never smoked. She has never used smokeless tobacco. She reports current alcohol use of about 2.0 - 3.0 standard drinks per week. She reports current drug use. Drug: Marijuana.  Elementary school teacher, 3rd grade.   Past Medical History:  Diagnosis Date   ADD (attention deficit disorder)    Anxiety    Dysmenorrhea    Thyroid disease    hypothyroid    Past Surgical History:  Procedure Laterality Date   polyp removed as child      Current Outpatient Medications  Medication Sig Dispense Refill   betamethasone valerate ointment (VALISONE) 0.1 % Apply 1 application topically 2 (two) times daily. User for up to 1-2 weeks as needed  30 g 0   levothyroxine (SYNTHROID) 50 MCG tablet TAKE 1 TABLET BY MOUTH EVERY DAY 90 tablet 1   JUNEL FE 1/20 1-20 MG-MCG tablet TAKE 1 TABLET BY MOUTH EVERY DAY (Patient not taking: Reported on 12/02/2021) 84 tablet 0   lisdexamfetamine (VYVANSE) 40 MG capsule Take 1 capsule (40 mg total) by mouth daily before breakfast. 30 capsule 0   No current facility-administered medications for this visit.    Family History  Problem Relation Age of Onset   Diabetes Mother    Heart disease Mother    Cervical cancer Mother    Allergic rhinitis Father    Breast cancer Paternal Grandmother    Angioedema Neg Hx    Asthma Neg Hx    Atopy Neg Hx    Urticaria Neg Hx     Review of Systems  Genitourinary:  Positive for vaginal pain.  All other systems reviewed and are negative.  Exam:   BP 124/70    Pulse 82    Ht 5\' 2"  (1.575 m)    Wt 212 lb (96.2 kg)    LMP 11/17/2021    SpO2 100%    BMI 38.78 kg/m   Weight change: @WEIGHTCHANGE @ Height:   Height: 5\' 2"  (157.5 cm)  Ht Readings from Last 3 Encounters:  12/02/21 5\' 2"  (1.575 m)  07/06/21 5\' 2"  (1.575 m)  06/02/21 5\' 5"  (1.651 m)    General appearance: alert, cooperative and appears  stated age Head: Normocephalic, without obvious abnormality, atraumatic Neck: no adenopathy, supple, symmetrical, trachea midline and thyroid normal to inspection and palpation Lungs: clear to auscultation bilaterally Cardiovascular: regular rate and rhythm Breasts: normal appearance, no masses or tenderness Abdomen: soft, non-tender; non distended,  no masses,  no organomegaly Extremities: extremities normal, atraumatic, no cyanosis or edema Skin: Skin color, texture, turgor normal. No rashes or lesions Lymph nodes: Cervical, supraclavicular, and axillary nodes normal. No abnormal inguinal nodes palpated Neurologic: Grossly normal   Pelvic: External genitalia:  no lesions              Urethra:  normal appearing urethra with no masses, tenderness or lesions               Bartholins and Skenes: normal                 Vagina: normal appearing vagina with normal color and discharge, no lesions              Cervix: no lesions               Bimanual Exam:  Uterus:  normal size, contour, position, consistency, mobility, non-tender              Adnexa: no mass, fullness, tenderness               Rectovaginal: Confirms               Anus:  normal sphincter tone, no lesions  Cornelia Copa, CMA chaperoned for the exam.  1. Well woman exam Discussed breast self exam Discussed calcium and vit D intake No screening labs needed  2. Screening for cervical cancer - Cytology - PAP  3. Screening examination for STD (sexually transmitted disease) - Cytology - PAP  4. Vaginal pain She will call if worsening  5. LLQ abdominal pain If persists, she will call to set up an ultrasound  6. Encounter for surveillance of contraceptive pills - norethindrone-ethinyl estradiol-FE (JUNEL FE 1/20) 1-20 MG-MCG tablet; Take 1 tablet by mouth daily.  Dispense: 84 tablet; Refill: 3

## 2021-12-02 ENCOUNTER — Ambulatory Visit (INDEPENDENT_AMBULATORY_CARE_PROVIDER_SITE_OTHER): Payer: BC Managed Care – PPO | Admitting: Obstetrics and Gynecology

## 2021-12-02 ENCOUNTER — Other Ambulatory Visit (HOSPITAL_COMMUNITY)
Admission: RE | Admit: 2021-12-02 | Discharge: 2021-12-02 | Disposition: A | Discharge status: Home / Self Care | Payer: BC Managed Care – PPO | Source: Ambulatory Visit | Attending: Obstetrics and Gynecology | Admitting: Obstetrics and Gynecology

## 2021-12-02 ENCOUNTER — Encounter: Payer: Self-pay | Admitting: Obstetrics and Gynecology

## 2021-12-02 ENCOUNTER — Other Ambulatory Visit: Payer: Self-pay

## 2021-12-02 VITALS — BP 124/70 | HR 82 | Ht 62.0 in | Wt 212.0 lb

## 2021-12-02 DIAGNOSIS — Z01419 Encounter for gynecological examination (general) (routine) without abnormal findings: Secondary | ICD-10-CM | POA: Diagnosis not present

## 2021-12-02 DIAGNOSIS — Z124 Encounter for screening for malignant neoplasm of cervix: Secondary | ICD-10-CM

## 2021-12-02 DIAGNOSIS — R1032 Left lower quadrant pain: Secondary | ICD-10-CM

## 2021-12-02 DIAGNOSIS — R102 Pelvic and perineal pain: Secondary | ICD-10-CM | POA: Diagnosis not present

## 2021-12-02 DIAGNOSIS — Z3041 Encounter for surveillance of contraceptive pills: Secondary | ICD-10-CM

## 2021-12-02 DIAGNOSIS — Z113 Encounter for screening for infections with a predominantly sexual mode of transmission: Secondary | ICD-10-CM

## 2021-12-02 MED ORDER — NORETHIN ACE-ETH ESTRAD-FE 1-20 MG-MCG PO TABS: 1.0000 | ORAL_TABLET | Freq: Every day | ORAL | 3 refills | Status: DC

## 2021-12-02 NOTE — Patient Instructions (Signed)

## 2021-12-04 LAB — CYTOLOGY - PAP
Adequacy: ABSENT
Chlamydia: NEGATIVE
Comment: NEGATIVE
Comment: NEGATIVE
Comment: NEGATIVE
Comment: NORMAL
Diagnosis: NEGATIVE
High risk HPV: NEGATIVE
Neisseria Gonorrhea: NEGATIVE
Trichomonas: NEGATIVE

## 2021-12-10 ENCOUNTER — Telehealth: Payer: Self-pay | Admitting: Nurse Practitioner

## 2021-12-10 NOTE — Telephone Encounter (Signed)
Patient aware needs appt. Appt made

## 2021-12-11 ENCOUNTER — Ambulatory Visit: Payer: BC Managed Care – PPO | Admitting: Nurse Practitioner

## 2021-12-11 ENCOUNTER — Encounter: Payer: Self-pay | Admitting: Nurse Practitioner

## 2021-12-11 VITALS — BP 120/76 | HR 64 | Temp 99.6°F | Resp 20 | Ht 62.0 in | Wt 212.0 lb

## 2021-12-11 DIAGNOSIS — F988 Other specified behavioral and emotional disorders with onset usually occurring in childhood and adolescence: Secondary | ICD-10-CM

## 2021-12-11 MED ORDER — LISDEXAMFETAMINE DIMESYLATE 40 MG PO CAPS: 40.0000 mg | ORAL_CAPSULE | Freq: Every day | ORAL | 0 refills | Status: DC

## 2021-12-11 NOTE — Patient Instructions (Signed)

## 2021-12-11 NOTE — Progress Notes (Signed)
Subjective:    Patient ID: Shelley Whitehead, female    DOB: [birth year], 31 y.o.   MRN: [id number]   Chief Complaint: adhd  HPI Patient comes in for follow up of ADHD. She is on vyvanse and is doing well. She only take son work days. She denies any medication side effects.    Review of Systems  Constitutional:  Negative for diaphoresis.  Eyes:  Negative for pain.  Respiratory:  Negative for shortness of breath.   Cardiovascular:  Negative for chest pain, palpitations and leg swelling.  Gastrointestinal:  Negative for abdominal pain.  Endocrine: Negative for polydipsia.  Skin:  Negative for rash.  Neurological:  Negative for dizziness, weakness and headaches.  Hematological:  Does not bruise/bleed easily.  All other systems reviewed and are negative.     Objective:   Physical Exam Vitals and nursing note reviewed.  Constitutional:      General: She is not in acute distress.    Appearance: Normal appearance. She is well-developed.  Neck:     Vascular: No carotid bruit or JVD.  Cardiovascular:     Rate and Rhythm: Normal rate and regular rhythm.     Heart sounds: Normal heart sounds.  Pulmonary:     Effort: Pulmonary effort is normal. No respiratory distress.     Breath sounds: Normal breath sounds. No wheezing or rales.  Chest:     Chest wall: No tenderness.  Abdominal:     General: Bowel sounds are normal. There is no distension or abdominal bruit.     Palpations: Abdomen is soft. There is no hepatomegaly, splenomegaly, mass or pulsatile mass.     Tenderness: There is no abdominal tenderness.  Musculoskeletal:        General: Normal range of motion.     Cervical back: Normal range of motion and neck supple.  Lymphadenopathy:     Cervical: No cervical adenopathy.  Skin:    General: Skin is warm and dry.  Neurological:     Mental Status: She is alert and oriented to person, place, and time.     Deep Tendon Reflexes: Reflexes are normal and symmetric.  Psychiatric:         Behavior: Behavior normal.        Thought Content: Thought content normal.        Judgment: Judgment normal.    BP 120/76    Pulse 64    Temp 99.6 F (37.6 C) (Oral)    Resp 20    Ht 5\' 2"  (1.575 m)    Wt 212 lb (96.2 kg)    LMP 11/17/2021    SpO2 98%    BMI 38.78 kg/m        Assessment & Plan:  11/19/2021 in today with chief complaint of No chief complaint on file.   1. Attention deficit disorder (ADD) without hyperactivity Stress management - lisdexamfetamine (VYVANSE) 40 MG capsule; Take 1 capsule (40 mg total) by mouth daily before breakfast.  Dispense: 30 capsule; Refill: 0 - lisdexamfetamine (VYVANSE) 40 MG capsule; Take 1 capsule (40 mg total) by mouth daily before breakfast.  Dispense: 30 capsule; Refill: 0 - lisdexamfetamine (VYVANSE) 40 MG capsule; Take 1 capsule (40 mg total) by mouth daily before breakfast.  Dispense: 30 capsule; Refill: 0    The above assessment and management plan was discussed with the patient. The patient verbalized understanding of and has agreed to the management plan. Patient is aware to call the clinic if symptoms persist or  worsen. Patient is aware when to return to the clinic for a follow-up visit. Patient educated on when it is appropriate to go to the emergency department.   Mary-Margaret Daphine Deutscher, FNP

## 2022-01-21 ENCOUNTER — Other Ambulatory Visit: Payer: Self-pay | Admitting: Nurse Practitioner

## 2022-01-21 DIAGNOSIS — E039 Hypothyroidism, unspecified: Secondary | ICD-10-CM

## 2022-02-17 ENCOUNTER — Emergency Department (HOSPITAL_COMMUNITY)
Admission: EM | Admit: 2022-02-17 | Discharge: 2022-02-18 | Disposition: A | Discharge status: Home / Self Care | Payer: BC Managed Care – PPO | Attending: Emergency Medicine | Admitting: Emergency Medicine

## 2022-02-17 ENCOUNTER — Ambulatory Visit
Admission: EM | Admit: 2022-02-17 | Discharge: 2022-02-17 | Disposition: A | Discharge status: Home / Self Care | Payer: BC Managed Care – PPO | Attending: Emergency Medicine | Admitting: Emergency Medicine

## 2022-02-17 ENCOUNTER — Emergency Department (HOSPITAL_COMMUNITY): Payer: BC Managed Care – PPO

## 2022-02-17 ENCOUNTER — Other Ambulatory Visit: Payer: Self-pay

## 2022-02-17 ENCOUNTER — Encounter (HOSPITAL_COMMUNITY): Payer: Self-pay

## 2022-02-17 DIAGNOSIS — Z87898 Personal history of other specified conditions: Secondary | ICD-10-CM | POA: Diagnosis not present

## 2022-02-17 DIAGNOSIS — R519 Headache, unspecified: Secondary | ICD-10-CM | POA: Diagnosis present

## 2022-02-17 DIAGNOSIS — Z79899 Other long term (current) drug therapy: Secondary | ICD-10-CM | POA: Insufficient documentation

## 2022-02-17 DIAGNOSIS — R55 Syncope and collapse: Secondary | ICD-10-CM

## 2022-02-17 DIAGNOSIS — R42 Dizziness and giddiness: Secondary | ICD-10-CM | POA: Diagnosis not present

## 2022-02-17 DIAGNOSIS — R2990 Unspecified symptoms and signs involving the nervous system: Secondary | ICD-10-CM | POA: Diagnosis not present

## 2022-02-17 DIAGNOSIS — G43909 Migraine, unspecified, not intractable, without status migrainosus: Secondary | ICD-10-CM | POA: Diagnosis not present

## 2022-02-17 DIAGNOSIS — G43109 Migraine with aura, not intractable, without status migrainosus: Secondary | ICD-10-CM

## 2022-02-17 DIAGNOSIS — Z91010 Allergy to peanuts: Secondary | ICD-10-CM | POA: Diagnosis not present

## 2022-02-17 LAB — BASIC METABOLIC PANEL
Anion gap: 9 (ref 5–15)
BUN: 12 mg/dL (ref 6–20)
CO2: 24 mmol/L (ref 22–32)
Calcium: 9.6 mg/dL (ref 8.9–10.3)
Chloride: 104 mmol/L (ref 98–111)
Creatinine, Ser: 0.7 mg/dL (ref 0.44–1.00)
GFR, Estimated: >60 mL/min (ref >60)
Glucose, Bld: 89 mg/dL (ref 70–99)
Potassium: 3.5 mmol/L (ref 3.5–5.1)
Sodium: 137 mmol/L (ref 135–145)

## 2022-02-17 LAB — ETHANOL: Alcohol, Ethyl (B): <10 mg/dL (ref <10)

## 2022-02-17 LAB — CBC WITH DIFFERENTIAL/PLATELET
Abs Immature Granulocytes: 0 10*3/uL (ref 0.00–0.07)
Basophils Absolute: 0 10*3/uL (ref 0.0–0.1)
Basophils Relative: 1 %
Eosinophils Absolute: 0.1 10*3/uL (ref 0.0–0.5)
Eosinophils Relative: 2 %
HCT: 40.9 % (ref 36.0–46.0)
Hemoglobin: 13.8 g/dL (ref 12.0–15.0)
Immature Granulocytes: 0 %
Lymphocytes Relative: 34 %
Lymphs Abs: 2.1 10*3/uL (ref 0.7–4.0)
MCH: 30.4 pg (ref 26.0–34.0)
MCHC: 33.7 g/dL (ref 30.0–36.0)
MCV: 90.1 fL (ref 80.0–100.0)
Monocytes Absolute: 0.5 10*3/uL (ref 0.1–1.0)
Monocytes Relative: 8 %
Neutro Abs: 3.4 10*3/uL (ref 1.7–7.7)
Neutrophils Relative %: 55 %
Platelets: 273 10*3/uL (ref 150–400)
RBC: 4.54 MIL/uL (ref 3.87–5.11)
RDW: 13 % (ref 11.5–15.5)
WBC: 6.1 10*3/uL (ref 4.0–10.5)
nRBC: 0 % (ref 0.0–0.2)

## 2022-02-17 LAB — I-STAT BETA HCG BLOOD, ED (MC, WL, AP ONLY): I-stat hCG, quantitative: <5 m[IU]/mL (ref <5)

## 2022-02-17 NOTE — ED Notes (Signed)
Patient is being discharged from the Urgent Care and sent to the Emergency Department via POA with friend. Per L. Morgan-Scales PA-C, patient is in need of higher level of care due to syncopal episode & need for further evaluation. Patient is aware and verbalizes understanding of plan of care.  ?Vitals:  ? 02/17/22 1522  ?BP: (!) 111/59  ?Pulse: 78  ?Resp: 18  ?Temp: 98.5 ?F (36.9 ?C)  ?SpO2: 98%  ?  ?

## 2022-02-17 NOTE — ED Provider Triage Note (Signed)
Emergency Medicine Provider Triage Evaluation Note ? ?Shelley Whitehead , a 31 y.o. female  was evaluated in triage.  Pt complains of possible seizures.  Patient reports that 2 weeks prior she had an episode of single episode that was followed by altered mental status.  Today while at work at [employer] she says that on her desk and had a syncopal episode that was followed by confusion.  Patient was sent to the emergency department due to concern for possible seizure.  Additionally patient endorses headache every day since her first syncopal episode 2 weeks prior.  Today headache onset was gradual pain progressively worse over time.  Patient does endorse decree sensation to the right side of her face. ? ?Review of Systems  ?Positive: Loss of consciousness, headache, decreased sensation ?Negative: Numbness, weakness, visual disturbance, chest pain, shortness of breath ? ?Physical Exam  ?BP (!) 130/57 (BP Location: Left Arm)   Pulse 71   Temp 98.5 ?F (36.9 ?C) (Oral)   Resp 16   LMP 02/10/2022   SpO2 100%  ?Gen:   Awake, no distress   ?Resp:  Normal effort  ?MSK:   Moves extremities without difficulty  ?Other:  +2 radial pulse bilaterally ? ?Decree sensation to light touch of right side of patient's face.  EOM intact bilaterally.  Pupils PERRL.  +5 strength to bilateral upper and lower extremities.  Normal finger-to-nose.  Negative pronator drift.  Patient able to stand and ambulate without difficulty. ? ?Medical Decision Making  ?Medically screening exam initiated at 5:16 PM.  Appropriate orders placed.  Oliana Gowens was informed that the remainder of the evaluation will be completed by another provider, this initial triage assessment does not replace that evaluation, and the importance of remaining in the ED until their evaluation is complete. ? ?We will obtain noncontrast head CT, EKG, and lab work to evaluate for possible causes of seizure. ?  ?Haskel Schroeder, PA-C ?02/17/22 1718 ? ?

## 2022-02-17 NOTE — ED Provider Notes (Addendum)
?[facility] ? ? ? ?CSN: [id number] ?Arrival date & time: 02/17/22  1509 ?  ? ?HISTORY  ? ?Chief Complaint  ?Patient presents with  ? Dizziness  ? ?HPI ?Shelley Whitehead is a 31 y.o. female. Patient states that 2 weeks ago she was out of town with friends on vacation.  Patient states in the evening, after skiing all day, she began to feel dizzy and decided to go to her room to lie down.  Upon standing, she passed out and fell straight to the floor.  Patient states her boyfriend and her traveling companions all witnessed her fall, advised her that she did not hit her head, states she was unconscious for about 5 seconds then awoke spontaneously.  Patient states that when she awoke, she was sweaty, she felt hot and she felt confused, she was still dizzy and her ears were ringing loudly.  Patient states she also felt she was unable to form complete sentences or mentate well.  Patient states there was also about 15 minutes before she felt like she could walk without falling.  Once she felt like her faculties have returned and she was able to walk, she states she is continue to feel as if there was a "cloudiness" about her, like things seemed "fuzzy".  Patient states that because she was in [place], [place] at the time, she did not think much about it and attributed the episode to being tired from skiing all day, having had 1 alcoholic drink earlier that afternoon and being at a high altitude.  Patient states she had never had a similar experience in the past. ? 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  Patient states the dizziness progressed and she is pretty sure she lost consciousness for a few seconds, is unsure how long, but awoke feeling the same as before, still sitting in the  chair with her head down on her desk, still dizzy, sweaty, hot, a little confused, had loud ringing in her ears and lies unable to walk for about 15 minutes.  Patient states she would have called 911 at the time but was afraid to stand up and walk out of the room for fear of falling and startling her statements.  Patient states she came year to urgent care immediately after this occurred. ? ?Patient states her last menstrual period was 7 days ago with normal to heavy bleeding.  Patient reports compliance with her birth control pills and states she is confident that she is not pregnant.  Patient politely declined urine pregnancy test today.   ? ?EMR reviewed, patient does have a history of headaches, has been referred to the headache clinic for further evaluation but I do not see any documentation of a visit with the headache clinic in her chart.  Patient takes Vyvanse for ADHD syndrome and also has a history of hypothyroid.  Last TSH was normal 8 months ago but she does have a history of an elevated TSH in the past.  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 ? ?Patient states that at this time she is still feeling a little bit dizzy and her ears are still ringing.  She also endorses that she continues to have the same cloudy,  fuzzy feeling that she had before. ? ?The history is provided by the patient.  ?Past Medical History:  ?Diagnosis Date  ? ADD (attention deficit disorder)   ? Anxiety   ? Dysmenorrhea   ? Thyroid disease   ? hypothyroid  ? ?Patient Active Problem List  ? Diagnosis Date Noted  ? 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 Lymphadenopathy 05/06/2021  ? Panic attacks 02/13/2019  ? Acquired hypothyroidism 12/01/2018  ? ADD (attention deficit disorder) 07/27/2013  ? Depression 01/14/2011  ? ?Past Surgical History:  ?Procedure Laterality Date  ? polyp removed as child    ? ?OB History   ? ? Gravida  ?0  ? Para  ?0  ? Term  ?0  ? Preterm  ?0  ? AB  ?0  ?  Living  ?0  ?  ? ? SAB  ?0  ? IAB  ?0  ? Ectopic  ?0  ? Multiple  ?0  ? Live Births  ?0  ?   ?  ?  ? ?Home Medications   ? ?Prior to Admission medications   ?Medication Sig Start Date End Date Taking? Authorizing Provider  ?betamethasone valerate ointment (VALISONE) 0.1 % Apply 1 application topically 2 (two) times daily. User for up to 1-2 weeks as needed 07/06/21   Romualdo Bolk, MD  ?levothyroxine (SYNTHROID) 50 MCG tablet TAKE 1 TABLET BY MOUTH EVERY DAY 01/21/22   Bennie Pierini, FNP  ?lisdexamfetamine (VYVANSE) 40 MG capsule Take 1 capsule (40 mg total) by mouth daily before breakfast. 02/09/22 03/11/22  Bennie Pierini, FNP  ?lisdexamfetamine (VYVANSE) 40 MG capsule Take 1 capsule (40 mg total) by mouth daily before breakfast. 01/10/22 02/09/22  Bennie Pierini, FNP  ?lisdexamfetamine (VYVANSE) 40 MG capsule Take 1 capsule (40 mg total) by mouth daily before breakfast. 12/11/21 01/10/22  Bennie Pierini, FNP  ?norethindrone-ethinyl estradiol-FE (JUNEL FE 1/20) 1-20 MG-MCG tablet Take 1 tablet by mouth daily. 12/02/21   Romualdo Bolk, MD  ? ? ?Family History ?Family History  ?Problem Relation Age of Onset  ? Diabetes Mother   ? Heart disease Mother   ? Cervical cancer Mother   ? Allergic rhinitis Father   ? Breast cancer Paternal Grandmother   ? Angioedema Neg Hx   ? Asthma Neg Hx   ? Atopy Neg Hx   ? Urticaria Neg Hx   ? ?Social History ?Social History  ? ?Tobacco Use  ? Smoking status: Never  ? Smokeless tobacco: Never  ?Vaping Use  ? Vaping Use: Never used  ?Substance Use Topics  ? Alcohol use: Yes  ?  Alcohol/week: 2.0 - 3.0 standard drinks  ?  Types: 2 - 3 Standard drinks or equivalent per week  ? Drug use: Yes  ?  Types: Marijuana  ?  Comment: seldom  ? ?Allergies   ?Peanut-containing drug products and Soy allergy ? ?Review of Systems ?Review of Systems ?Pertinent findings noted in history of present illness.  ? ?Physical Exam ?Triage Vital Signs ?ED Triage Vitals  ?Enc  Vitals Group  ?   BP 08/21/21 0827 (!) 147/82  ?   Pulse Rate 08/21/21 0827 72  ?   Resp 08/21/21 0827 18  ?   Temp 08/21/21 0827 98.3 ?F (36.8 ?C)  ?   Temp Source 08/21/21 0827 Oral  ?   SpO2 08/21/21 0827 98 %  ?   Weight --   ?   Height --   ?   Head Circumference --   ?  Peak Flow --   ?   Pain Score 08/21/21 0826 5  ?   Pain Loc --   ?   Pain Edu? --   ?   Excl. in GC? --   ?Orthostatic VS for the past 24 hrs: ? BP- Lying Pulse- Lying BP- Sitting Pulse- Sitting BP- Standing at 0 minutes Pulse- Standing at 0 minutes  ?02/17/22 1543 129/79 71 131/80 71 133/80 77  ? ? ?Updated Vital Signs ?BP (!) 111/59 (BP Location: Right Arm)   Pulse 78   Temp 98.5 ?F (36.9 ?C) (Oral)   Resp 18   LMP 02/10/2022   SpO2 98%  ? ?Physical Exam ?Eyes:  ?   General: Lids are normal. Visual field deficit (Right visual field is weaker than the left) present.  ?   Extraocular Movements:  ?   Right eye: Abnormal extraocular motion and nystagmus present.  ?   Left eye: Normal extraocular motion and no nystagmus.  ?   Pupils: Pupils are equal.  ?   Right eye: Pupil is sluggish (delayed accommodation). Pupil is round and reactive. No corneal abrasion.  ?   Left eye: Pupil is round, reactive and not sluggish. No corneal abrasion.  ?   Visual Fields: Left eye visual fields normal.  ?   Comments: Right pupil slow to accommodate to light shone in left   ?Neurological:  ?   Mental Status: She is alert and oriented to person, place, and time. Mental status is at baseline.  ?   Cranial Nerves: Cranial nerve deficit present. No dysarthria or facial asymmetry.  ?   Sensory: Sensory deficit (decreased facial nerve sensation on the right) present.  ?   Motor: Pronator drift (right) present. No weakness, tremor, atrophy, abnormal muscle tone or seizure activity.  ?   Coordination: Romberg sign negative. Coordination normal. Finger-Nose-Finger Test abnormal.  ?   Gait: Tandem walk abnormal (in reverse, normal tandem walking forward). Gait normal.   ?   Comments: Patient experienced dysarthria immediately after her episode this afternoon, patient states it has resolved  ?Psychiatric:     ?   Attention and Perception: Attention and perception normal.     ?

## 2022-02-17 NOTE — ED Notes (Signed)
Currently waiting on patients friend to pick her up.  ?

## 2022-02-17 NOTE — ED Triage Notes (Signed)
Patient c/o dizziness that began a few months ago. Patient states while in [place] she got dizzy, lightheaded and passed out hitting her head. Patient states today at work she got dizzy, lightheaded and passed out in the chair, the patient states it was hard for her to speak and move for a few minutes after. At this time patient denies light sensitively but does see white spots and has had an increase in headaches this week.  ?LMP was about 7 days ago,.  ?

## 2022-02-17 NOTE — ED Triage Notes (Signed)
Reports passed out 2 weeks ago in [place] and then it happened again today but had difficulty talking walking and moving for a few minutes afterwards.  Reports provider at [facility] said pupils were different and she had right sided weakness.   ?

## 2022-02-18 ENCOUNTER — Emergency Department (HOSPITAL_COMMUNITY): Payer: BC Managed Care – PPO

## 2022-02-18 MED ORDER — GADOBUTROL 1 MMOL/ML IV SOLN
9.0000 mL | Freq: Once | INTRAVENOUS | Status: AC | PRN
  Administered 2022-02-18: 9 mL via INTRAVENOUS

## 2022-02-18 MED ORDER — OXYCODONE-ACETAMINOPHEN 5-325 MG PO TABS
1.0000 | ORAL_TABLET | Freq: Once | ORAL | Status: AC
  Administered 2022-02-18: 1 via ORAL
  Filled 2022-02-18: qty 1

## 2022-02-18 NOTE — ED Notes (Signed)
Patient to MRI at this time.

## 2022-02-18 NOTE — ED Provider Notes (Signed)
?[facility] EMERGENCY DEPARTMENT ?Provider Note ? ? ?CSN: [id number] ?Arrival date & time: 02/17/22  1647 ? ?  ? ?History ? ?Chief Complaint  ?Patient presents with  ? Loss of Consciousness  ? ? ?Shelley Whitehead is a 31 y.o. female. ? ?Patient presents to the emergency department for evaluation of syncope and neurologic deficit.  Patient reports that she passed out today while at work.  This is the second time she has passed out on the last few weeks.  Both times she had symptoms of feeling weak, dizzy and a sensation that she was going to pass out before it occurred.  Both times she woke up confused and had difficulty clearing her head.  No one that witnessed the episode suggested seizure, however.  She did develop a headache with both episodes and does have a history of recurrent headaches. ? ?Patient reports that she was seen at urgent care today after the episode.  She was noted to have a much bigger right pupil than left and was having difficulty with vision in her right eye.  She was also experiencing numbness and weakness of the right arm and leg.  All symptoms have now resolved. ? ? ?  ? ?Home Medications ?Prior to Admission medications   ?Medication Sig Start Date End Date Taking? Authorizing Provider  ?ibuprofen (ADVIL) 200 MG tablet Take 400 mg by mouth every 6 (six) hours as needed for headache or moderate pain.   Yes [provider]  ?levocetirizine (XYZAL) 5 MG tablet Take 5 mg by mouth daily.   Yes [provider]  ?levothyroxine (SYNTHROID) 50 MCG tablet TAKE 1 TABLET BY MOUTH EVERY DAY ?Patient taking differently: Take 50 mcg by mouth daily before breakfast. 01/21/22  Yes Bennie Pierini, FNP  ?betamethasone valerate ointment (VALISONE) 0.1 % Apply 1 application topically 2 (two) times daily. User for up to 1-2 weeks as needed ?Patient taking differently: Apply 1 application. topically 2 (two) times daily as needed (rash). 07/06/21   Romualdo Bolk, MD   ?lisdexamfetamine (VYVANSE) 40 MG capsule Take 1 capsule (40 mg total) by mouth daily before breakfast. ?Patient taking differently: Take 40 mg by mouth See admin instructions. Monday-friday 02/09/22 03/11/22  Bennie Pierini, FNP  ?norethindrone-ethinyl estradiol-FE (JUNEL FE 1/20) 1-20 MG-MCG tablet Take 1 tablet by mouth daily. 12/02/21   Romualdo Bolk, MD  ?   ? ?Allergies    ?Peanut-containing drug products and Soy allergy   ? ?Review of Systems   ?Review of Systems  ?Neurological:  Positive for syncope, weakness, numbness and headaches.  ? ?Physical Exam ?Updated Vital Signs ?BP 116/70 (BP Location: Right Arm)   Pulse 85   Temp 100.1 ?F (37.8 ?C) (Oral)   Resp 17   LMP 02/10/2022   SpO2 100%  ?Physical Exam ?Vitals and nursing note reviewed.  ?Constitutional:   ?   General: She is not in acute distress. ?   Appearance: She is well-developed.  ?HENT:  ?   Head: Normocephalic and atraumatic.  ?   Mouth/Throat:  ?   Mouth: Mucous membranes are moist.  ?Eyes:  ?   General: Vision grossly intact. Gaze aligned appropriately.  ?   Extraocular Movements: Extraocular movements intact.  ?   Conjunctiva/sclera: Conjunctivae normal.  ?Cardiovascular:  ?   Rate and Rhythm: Normal rate and regular rhythm.  ?   Pulses: Normal pulses.  ?   Heart sounds: Normal heart sounds, S1 normal and S2 normal. No murmur heard. ?  No friction rub. No gallop.  ?Pulmonary:  ?   Effort: Pulmonary effort is normal. No respiratory distress.  ?   Breath sounds: Normal breath sounds.  ?Abdominal:  ?   General: Bowel sounds are normal.  ?   Palpations: Abdomen is soft.  ?   Tenderness: There is no abdominal tenderness. There is no guarding or rebound.  ?   Hernia: No hernia is present.  ?Musculoskeletal:     ?   General: No swelling.  ?   Cervical back: Full passive range of motion without pain, normal range of motion and neck supple. No spinous process tenderness or muscular tenderness. Normal range of motion.  ?   Right lower  leg: No edema.  ?   Left lower leg: No edema.  ?Skin: ?   General: Skin is warm and dry.  ?   Capillary Refill: Capillary refill takes less than 2 seconds.  ?   Findings: No ecchymosis, erythema, rash or wound.  ?Neurological:  ?   General: No focal deficit present.  ?   Mental Status: She is alert and oriented to person, place, and time.  ?   GCS: GCS eye subscore is 4. GCS verbal subscore is 5. GCS motor subscore is 6.  ?   Cranial Nerves: Cranial nerves 2-12 are intact.  ?   Sensory: Sensation is intact.  ?   Motor: Motor function is intact.  ?   Coordination: Coordination is intact.  ?Psychiatric:     ?   Attention and Perception: Attention normal.     ?   Mood and Affect: Mood normal.     ?   Speech: Speech normal.     ?   Behavior: Behavior normal.  ? ? ?ED Results / Procedures / Treatments   ?Labs ?(all labs ordered are listed, but only abnormal results are displayed) ?Labs Reviewed  ?CBC WITH DIFFERENTIAL/PLATELET  ?BASIC METABOLIC PANEL  ?ETHANOL  ?RAPID URINE DRUG SCREEN, HOSP PERFORMED  ?I-STAT BETA HCG BLOOD, ED ([facility], [facility], [facility] ONLY)  ?CBG MONITORING, ED  ? ? ?EKG ?EKG Interpretation ? ?Date/Time:  Wednesday February 17 2022 17:25:22 EDT ?Ventricular Rate:  69 ?PR Interval:  170 ?QRS Duration: 74 ?QT Interval:  398 ?QTC Calculation: 426 ?R Axis:   74 ?Text Interpretation: Normal sinus rhythm EKG WITHIN NORMAL LIMITS Confirmed by Gilda Crease [phone]) on 02/18/2022 3:07:25 AM ? ?Radiology ?CT HEAD WO CONTRAST ( ) ? ?Result Date: 02/17/2022 ?CLINICAL DATA:  Seizure, loss of consciousness EXAM: CT HEAD WITHOUT CONTRAST TECHNIQUE: Contiguous axial images were obtained from the base of the skull through the vertex without intravenous contrast. RADIATION DOSE REDUCTION: This exam was performed according to the departmental dose-optimization program which includes automated exposure control, adjustment of the mA and/or kV according to patient size and/or use of iterative reconstruction technique. COMPARISON:   None. FINDINGS: Brain: No evidence of acute infarction, hemorrhage, hydrocephalus, extra-axial collection or mass lesion/mass effect. Vascular: No hyperdense vessel or unexpected calcification. Skull: Normal. Negative for fracture or focal lesion. Sinuses/Orbits: The visualized paranasal sinuses are essentially clear. The mastoid air cells are unopacified. Other: None. IMPRESSION: Normal head CT. Electronically Signed   By: Charline Bills M.D.   On: 02/17/2022 19:45  ? ?MR Brain W and Wo Contrast ? ?Result Date: 02/18/2022 ?CLINICAL DATA:  31 year old female with possible seizure. Dizziness. Altered mental status. EXAM: MRI HEAD WITHOUT AND WITH CONTRAST TECHNIQUE: Multiplanar, multiecho pulse sequences of the brain and surrounding structures were obtained without and  with intravenous contrast. CONTRAST:  9mL GADAVIST GADOBUTROL 1 MMOL/ML IV SOLN COMPARISON:  Head CT 02/17/2022. FINDINGS: Brain: Normal cerebral volume. No restricted diffusion to suggest acute infarction. No midline shift, mass effect, evidence of mass lesion, ventriculomegaly, extra-axial collection or acute intracranial hemorrhage. Cervicomedullary junction and pituitary are within normal limits. Wallace CullensGray and white matter signal is within normal limits throughout the brain. On thin slice coronal imaging the hippocampal formations appear symmetric and within normal limits (series 19, image 18). Other mesial temporal lobe structures appear symmetric and normal. No encephalomalacia or chronic cerebral blood products identified. No abnormal enhancement identified.  No dural thickening. Vascular: Major intracranial vascular flow voids are preserved. The distal right vertebral artery appears dominant. The major dural venous sinuses are enhancing and appear to be patent. Skull and upper cervical spine: Normal visible cervical spine. Visualized bone marrow signal is within normal limits. Sinuses/Orbits: Negative. Other: Grossly normal visible internal  auditory structures. Mastoids are clear. Normal stylomastoid foramina, visible scalp and face. IMPRESSION: Normal MRI appearance of the brain. Electronically Signed   By: Odessa FlemingH  Hall M.D.   On: 02/18/2022 07:03   ? ?Proc

## 2022-03-25 ENCOUNTER — Telehealth (INDEPENDENT_AMBULATORY_CARE_PROVIDER_SITE_OTHER): Payer: BC Managed Care – PPO | Admitting: Nurse Practitioner

## 2022-03-25 ENCOUNTER — Encounter: Payer: Self-pay | Admitting: Nurse Practitioner

## 2022-03-25 DIAGNOSIS — H1031 Unspecified acute conjunctivitis, right eye: Secondary | ICD-10-CM

## 2022-03-25 MED ORDER — OFLOXACIN 0.3 % OP SOLN
1.0000 [drp] | Freq: Four times a day (QID) | OPHTHALMIC | 0 refills | Status: DC
Start: 2022-03-25 — End: 2022-04-01

## 2022-03-25 NOTE — Progress Notes (Signed)
Virtual Visit Consent   Shelley Whitehead, you are scheduled for a virtual visit with Mary-Margaret Daphine Deutscher, FNP, a [facility] provider, today.     Just as with appointments in the office, your consent must be obtained to participate.  Your consent will be active for this visit and any virtual visit you may have with one of our providers in the next 365 days.     If you have a MyChart account, a copy of this consent can be sent to you electronically.  All virtual visits are billed to your insurance company just like a traditional visit in the office.    As this is a virtual visit, video technology does not allow for your provider to perform a traditional examination.  This may limit your provider's ability to fully assess your condition.  If your provider identifies any concerns that need to be evaluated in person or the need to arrange testing (such as labs, EKG, etc.), we will make arrangements to do so.     Although advances in technology are sophisticated, we cannot ensure that it will always work on either your end or our end.  If the connection with a video visit is poor, the visit may have to be switched to a telephone visit.  With either a video or telephone visit, we are not always able to ensure that we have a secure connection.     I need to obtain your verbal consent now.   Are you willing to proceed with your visit today? YES   Shelley Whitehead has provided verbal consent on 03/25/2022 for a virtual visit (video or telephone).   Mary-Margaret Daphine Deutscher, FNP   Date: 03/25/2022 1:08 PM   Virtual Visit via Video Note   I, Mary-Margaret Daphine Deutscher, connected with Shelley Whitehead ([id number], 1991-09-05) on 03/25/22 at  3:05 PM EDT by a video-enabled telemedicine application and verified that I am speaking with the correct person using two identifiers.  Location: Patient: Virtual Visit Location Patient: Other: work Provider: Pharmacist, community: Mobile   I discussed the limitations  of evaluation and management by telemedicine and the availability of in person appointments. The patient expressed understanding and agreed to proceed.    History of Present Illness: Shelley Whitehead is a 31 y.o. who identifies as a female who was assigned female at birth, and is being seen today for pink eye.  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 Is red and watery. Slight pain  which she rates 3/10. She has done nothing for it.   Review of Systems  Eyes:  Positive for pain, discharge and redness. Negative for blurred vision, double vision and photophobia.   Problems:  Patient Active Problem List   Diagnosis Date Noted   Other headache syndrome 05/06/2021   Lymphadenopathy 05/06/2021   Panic attacks 02/13/2019   Acquired hypothyroidism 12/01/2018   ADD (attention deficit disorder) 07/27/2013   Depression 01/14/2011    Allergies:  Allergies  Allergen Reactions   Peanut-Containing Drug Products Swelling   Soy Allergy Hives   Medications:  Current Outpatient Medications:    betamethasone valerate ointment (VALISONE) 0.1 %, Apply 1 application topically 2 (two) times daily. User for up to 1-2 weeks as needed (Patient taking differently: Apply 1 application. topically 2 (two) times daily as needed (rash).), Disp: 30 g, Rfl: 0   ibuprofen (ADVIL) 200 MG tablet, Take 400 mg by mouth every 6 (six) hours as needed  for headache or moderate pain., Disp: , Rfl:    levocetirizine (XYZAL) 5 MG tablet, Take 5 mg by mouth daily., Disp: , Rfl:    levothyroxine (SYNTHROID) 50 MCG tablet, TAKE 1 TABLET BY MOUTH EVERY DAY (Patient taking differently: Take 50 mcg by mouth daily before breakfast.), Disp: 90 tablet, Rfl: 1   lisdexamfetamine (VYVANSE) 40 MG capsule, Take 1 capsule (40 mg total) by mouth daily before breakfast. (Patient taking differently: Take 40 mg by mouth See admin instructions. Monday-friday), Disp: 30 capsule, Rfl: 0    norethindrone-ethinyl estradiol-FE (JUNEL FE 1/20) 1-20 MG-MCG tablet, Take 1 tablet by mouth daily., Disp: 84 tablet, Rfl: 3  Observations/Objective: Patient is well-developed, well-nourished in no acute distress.  Resting comfortably  at work  Head is normocephalic, atraumatic.  No labored breathing.  Speech is clear and coherent with logical content.  Patient is alert and oriented at baseline.  Right scleral injection- no drainage seenon video   Assessment and Plan:  Shelley Whitehead in today with chief complaint of Conjunctivitis   1. Acute bacterial conjunctivitis of right eye Good hand washing ' cool compresses Avoid rubbing eye  Meds ordered this encounter  Medications   ofloxacin (OCUFLOX) 0.3 % ophthalmic solution    Sig: Place 1 drop into the right eye 4 (four) times daily.    Dispense:  5 mL    Refill:  0    Order Specific Question:   Supervising Provider    Answer:   Arville Care A [[id number]]     Follow Up Instructions: I discussed the assessment and treatment plan with the patient. The patient was provided an opportunity to ask questions and all were answered. The patient agreed with the plan and demonstrated an understanding of the instructions.  A copy of instructions were sent to the patient via MyChart.  The patient was advised to call back or seek an in-person evaluation if the symptoms worsen or if the condition fails to improve as anticipated.  Time:  I spent 12 minutes with the patient via telehealth technology discussing the above problems/concerns.    Mary-Margaret Daphine Deutscher, FNP

## 2022-03-25 NOTE — Patient Instructions (Signed)
Bacterial Conjunctivitis, Adult Bacterial conjunctivitis is an infection of your conjunctiva. This is the clear membrane that covers the white part of your eye and the inner part of your eyelid. This infection can make your eye: Red or pink. Itchy or irritated. This condition spreads easily from person to person (is contagious) and from one eye to the other eye. What are the causes? This condition is caused by germs (bacteria). You may get the infection if you come into close contact with: A person who has the infection. Items that have germs on them (are contaminated), such as face towels, contact lens solution, or eye makeup. What increases the risk? You are more likely to get this condition if: You have contact with people who have the infection. You wear contact lenses. You have a sinus infection. You have had a recent eye injury or surgery. You have a weak body defense system (immune system). You have dry eyes. What are the signs or symptoms?  Thick, yellowish discharge from the eye. Tearing or watery eyes. Itchy eyes. Burning feeling in your eyes. Eye redness. Swollen eyelids. Blurred vision. How is this treated?  Antibiotic eye drops or ointment. Antibiotic medicine taken by mouth. This is used for infections that do not get better with drops or ointment or that last more than 10 days. Cool, wet cloths placed on the eyes. Artificial tears used 2-6 times a day. Follow these instructions at home: Medicines Take or apply your antibiotic medicine as told by your doctor. Do not stop using it even if you start to feel better. Take or apply over-the-counter and prescription medicines only as told by your doctor. Do not touch your eyelid with the eye-drop bottle or the ointment tube. Managing discomfort Wipe any fluid from your eye with a warm, wet washcloth or a cotton ball. Place a clean, cool, wet cloth on your eye. Do this for 10-20 minutes, 3-4 times a day. General  instructions Do not wear contacts until the infection is gone. Wear glasses until your doctor says it is okay to wear contacts again. Do not wear eye makeup until the infection is gone. Throw away old eye makeup. Change or wash your pillowcase every day. Do not share towels or washcloths. Wash your hands often with soap and water for at least 20 seconds and especially before touching your face or eyes. Use paper towels to dry your hands. Do not touch or rub your eyes. Do not drive or use heavy machinery if your vision is blurred. Contact a doctor if: You have a fever. You do not get better after 10 days. Get help right away if: You have a fever and your symptoms get worse all of a sudden. You have very bad pain when you move your eye. Your face: Hurts. Is red. Is swollen. You have sudden loss of vision. Summary Bacterial conjunctivitis is an infection of your conjunctiva. This infection spreads easily from person to person. Wash your hands often with soap and water for at least 20 seconds and especially before touching your face or eyes. Use paper towels to dry your hands. Take or apply your antibiotic medicine as told by your doctor. Contact a doctor if you have a fever or you do not get better after 10 days. This information is not intended to replace advice given to you by your health care provider. Make sure you discuss any questions you have with your health care provider. Document Revised: 01/21/2021 Document Reviewed: 01/21/2021 Elsevier Patient Education    2023 Elsevier Inc.  

## 2022-04-01 ENCOUNTER — Encounter: Payer: Self-pay | Admitting: Neurology

## 2022-04-01 ENCOUNTER — Ambulatory Visit: Payer: BC Managed Care – PPO | Admitting: Neurology

## 2022-04-01 VITALS — BP 122/75 | HR 80 | Ht 62.0 in | Wt 210.0 lb

## 2022-04-01 DIAGNOSIS — G43709 Chronic migraine without aura, not intractable, without status migrainosus: Secondary | ICD-10-CM

## 2022-04-01 MED ORDER — AMITRIPTYLINE HCL 25 MG PO TABS: 25.0000 mg | ORAL_TABLET | Freq: Every day | ORAL | 3 refills | Status: DC

## 2022-04-01 MED ORDER — SUMATRIPTAN SUCCINATE 50 MG PO TABS: 50.0000 mg | ORAL_TABLET | ORAL | 3 refills | Status: DC | PRN

## 2022-04-01 NOTE — Progress Notes (Signed)
[facility]  PATIENT: Shelley Whitehead DOB: 12-01-1990  REQUESTING CLINICIAN: Orpah Greek,* HISTORY FROM: Patient  REASON FOR VISIT: Headaches/Syncope    HISTORICAL  CHIEF COMPLAINT:  Chief Complaint  Patient presents with   New Patient (Initial Visit)    Rm 13. Alone. NP internal referral for Syncopal episodes, headaches.    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  Patient reports last April 2022 she started experiencing migraine headaches, states that headaches usually start in the back of her neck on the left side then radiate to the front.  Headaches are throbbing in nature, associated with nausea vomiting and sensitivity to light and noise.  She did follow-up with a headache clinic, and was prescribed zonisamide as preventive medication.  Patient reported the medication gave her very vivid dream therefore she discontinued and have not follow-up again at the headaches clinic.  She reported in April she was visiting in [place] then had a sudden onset of dizziness, stood up, felt lightheaded and passed out.  She did not go to the hospital.  After that syncopal episode she did have headaches.  She reports 2 weeks later while at work she had the same feeling of dizziness lightheadedness feeling like she is going to pass out, she went inside to her desk and passed out on her desk for about 5 to 10 seconds, afterward she had severe headache.  She presented to urgent care, was noted to have right pupil dilation and numbness on the right side of her face therefore she was directed to the ED for further work-up.  In the ED, she had work-up including MRI brain with and without contrast which was negative for any acute cranial abnormality.  She was diagnosed with complex migraine but was not started on any preventive  medication.  Patient reported on average, she will have 15 to 17 days of headache per month, currently she is not on any abortive or preventive medication.     OTHER MEDICAL CONDITIONS: ADHD, Depression, Hypothyroidism    REVIEW OF SYSTEMS: Full 14 system review of systems performed and negative with exception of: as noted in the HPI  ALLERGIES: Allergies  Allergen Reactions   Peanut-Containing Drug Products Swelling   Soy Allergy Hives    HOME MEDICATIONS: Outpatient Medications Prior to Visit  Medication Sig Dispense Refill   betamethasone valerate ointment (VALISONE) 0.1 % Apply 1 application topically 2 (two) times daily. User for up to 1-2 weeks as needed (Patient taking differently: Apply 1 application. topically 2 (two) times daily as needed (rash).) 30 g 0   ibuprofen (ADVIL) 200 MG tablet Take 400 mg by mouth every 6 (six) hours as needed for headache or moderate pain.     levocetirizine (XYZAL) 5 MG tablet Take 5 mg by mouth daily.     levothyroxine (SYNTHROID) 50 MCG tablet TAKE 1 TABLET BY MOUTH EVERY DAY (Patient taking differently: Take 50 mcg by mouth daily before breakfast.) 90 tablet 1   norethindrone-ethinyl estradiol-FE (JUNEL FE 1/20) 1-20 MG-MCG tablet Take 1 tablet by mouth daily. 84 tablet 3   lisdexamfetamine (VYVANSE) 40 MG capsule Take 1 capsule (40 mg total) by mouth daily before breakfast. (Patient taking differently: Take 40 mg by mouth See admin instructions. Monday-friday) 30 capsule 0   ofloxacin (OCUFLOX) 0.3 % ophthalmic solution Place 1 drop into the right eye  4 (four) times daily. 5 mL 0   No facility-administered medications prior to visit.    PAST MEDICAL HISTORY: Past Medical History:  Diagnosis Date   ADD (attention deficit disorder)    Anxiety    Dysmenorrhea    Thyroid disease    hypothyroid    PAST SURGICAL HISTORY: Past Surgical History:  Procedure Laterality Date   polyp removed as child      FAMILY HISTORY: Family History   Problem Relation Age of Onset   Diabetes Mother    Heart disease Mother    Cervical cancer Mother    Allergic rhinitis Father    Breast cancer Paternal Grandmother    Angioedema Neg Hx    Asthma Neg Hx    Atopy Neg Hx    Urticaria Neg Hx     SOCIAL HISTORY: Social History   Socioeconomic History   Marital status: Single    Spouse name: Not on file   Number of children: Not on file   Years of education: Not on file   Highest education level: Not on file  Occupational History   Not on file  Tobacco Use   Smoking status: Never   Smokeless tobacco: Never  Vaping Use   Vaping Use: Never used  Substance and Sexual Activity   Alcohol use: Yes    Alcohol/week: 2.0 - 3.0 standard drinks of alcohol    Types: 2 - 3 Standard drinks or equivalent per week   Drug use: Yes    Types: Marijuana    Comment: seldom   Sexual activity: Not Currently    Birth control/protection: Condom  Other Topics Concern   Not on file  Social History Narrative   Not on file   Social Determinants of Health   Financial Resource Strain: Not on file  Food Insecurity: Not on file  Transportation Needs: Not on file  Physical Activity: Not on file  Stress: Not on file  Social Connections: Not on file  Intimate Partner Violence: Not on file    PHYSICAL EXAM  GENERAL EXAM/CONSTITUTIONAL: Vitals:  Vitals:   04/01/22 1446  BP: 122/75  Pulse: 80  Weight: 210 lb (95.3 kg)  Height: 5\' 2"  (1.575 m)   Body mass index is 38.41 kg/m. Wt Readings from Last 3 Encounters:  04/01/22 210 lb (95.3 kg)  12/11/21 212 lb (96.2 kg)  12/02/21 212 lb (96.2 kg)   Patient is in no distress; well developed, nourished and groomed; neck is supple  EYES: Pupils round and reactive to light, Visual fields full to confrontation, Extraocular movements intacts,   MUSCULOSKELETAL: Gait, strength, tone, movements noted in Neurologic exam below  NEUROLOGIC: MENTAL STATUS:      No data to display          awake, alert, oriented to person, place and time recent and remote memory intact normal attention and concentration language fluent, comprehension intact, naming intact fund of knowledge appropriate  CRANIAL NERVE:  2nd, 3rd, 4th, 6th - pupils equal and reactive to light, visual fields full to confrontation, extraocular muscles intact, no nystagmus 5th - facial sensation symmetric 7th - facial strength symmetric 8th - hearing intact 9th - palate elevates symmetrically, uvula midline 11th - shoulder shrug symmetric 12th - tongue protrusion midline  MOTOR:  normal bulk and tone, full strength in the BUE, BLE  SENSORY:  normal and symmetric to light touch, pinprick, temperature, vibration  COORDINATION:  finger-nose-finger, fine finger movements normal  REFLEXES:  deep tendon reflexes present and  symmetric  GAIT/STATION:  normal   DIAGNOSTIC DATA (LABS, IMAGING, TESTING) - I reviewed patient records, labs, notes, testing and imaging myself where available.  Lab Results  Component Value Date   WBC 6.1 02/17/2022   HGB 13.8 02/17/2022   HCT 40.9 02/17/2022   MCV 90.1 02/17/2022   PLT 273 02/17/2022      Component Value Date/Time   NA 137 02/17/2022 1738   NA 140 11/28/2019 1549   K 3.5 02/17/2022 1738   CL 104 02/17/2022 1738   CO2 24 02/17/2022 1738   GLUCOSE 89 02/17/2022 1738   BUN 12 02/17/2022 1738   BUN 13 11/28/2019 1549   CREATININE 0.70 02/17/2022 1738   CREATININE 0.58 12/01/2020 1523   CALCIUM 9.6 02/17/2022 1738   PROT 7.3 12/01/2020 1523   PROT 7.4 11/28/2019 1549   ALBUMIN 4.6 11/28/2019 1549   AST 17 12/01/2020 1523   ALT 12 12/01/2020 1523   ALKPHOS 84 11/28/2019 1549   BILITOT 0.4 12/01/2020 1523   BILITOT 0.4 11/28/2019 1549   GFRNONAA >60 02/17/2022 1738   GFRAA 150 11/28/2019 1549   Lab Results  Component Value Date   CHOL 151 12/01/2020   HDL 54 12/01/2020   LDLCALC 83 12/01/2020   TRIG 68 12/01/2020   CHOLHDL 2.8 12/01/2020    Lab Results  Component Value Date   HGBA1C 5.2 12/01/2020   No results found for: "VITAMINB12" Lab Results  Component Value Date   TSH 4.460 06/02/2021    MRI Brain 02/18/22 Normal MRI appearance of the brain.   ASSESSMENT AND PLAN  31 y.o. year old female with ADHD, hypothyroidism and depression who is presenting with migraine headaches.  Patient was trialed on zonisamide but due to side effects she discontinued the medication.  She continued to have headache and on average she is having 15 to 17 days of headaches per month.  At this time, I will start her on amitriptyline as preventive medication and also sumatriptan as abortive.  I have explained to the patient if amitriptyline is not enough to control her headaches, plan will be to try topiramate or the new CGRP medication likely Nurtec.  She is comfortable with plans.  I will see her in 3 months for follow-up   1. Chronic migraine without aura without status migrainosus, not intractable     Patient Instructions  Start with amitriptyline 25 mg nightly has been headache prevention, start with half tablet nightly for 1 week then increase to full tablet Use sumatriptan as needed for severe headache, can take an additional tablet 2 hours if the pain is not resolved but do not take more than 2 tablets in a 24-hour. Continue follow-up primary care doctor Return in 3 months  No orders of the defined types were placed in this encounter.   Meds ordered this encounter  Medications   amitriptyline (ELAVIL) 25 MG tablet    Sig: Take 1 tablet (25 mg total) by mouth at bedtime.    Dispense:  30 tablet    Refill:  3   SUMAtriptan (IMITREX) 50 MG tablet    Sig: Take 1 tablet (50 mg total) by mouth every 2 (two) hours as needed for migraine. May repeat in 2 hours if headache persists or recurs.    Dispense:  10 tablet    Refill:  3    Return in about 3 months (around 07/02/2022).    I have spent a total of 50 minutes dedicated to  this patient  today, preparing to see patient, performing a medically appropriate examination and evaluation, ordering tests and/or medications and procedures, and counseling and educating the patient/family/caregiver; independently interpreting result and communicating results to the family/patient/caregiver; and documenting clinical information in the electronic medical record.   Alric Ran, MD 04/01/2022, 4:56 PM  [facility] [address], [place], [place] [address] [phone]

## 2022-04-01 NOTE — Patient Instructions (Signed)
Start with amitriptyline 25 mg nightly has been headache prevention, start with half tablet nightly for 1 week then increase to full tablet Use sumatriptan as needed for severe headache, can take an additional tablet 2 hours if the pain is not resolved but do not take more than 2 tablets in a 24-hour. Continue follow-up primary care doctor Return in 3 months

## 2022-04-27 ENCOUNTER — Other Ambulatory Visit: Payer: Self-pay | Admitting: Neurology

## 2022-04-28 NOTE — Telephone Encounter (Signed)
Rx refilled for 90 day supply. 

## 2022-05-25 ENCOUNTER — Ambulatory Visit: Payer: BC Managed Care – PPO | Admitting: Nurse Practitioner

## 2022-05-25 ENCOUNTER — Encounter: Payer: Self-pay | Admitting: Nurse Practitioner

## 2022-05-25 VITALS — BP 137/85 | HR 67 | Temp 98.3°F | Resp 20 | Ht 62.0 in | Wt 220.0 lb

## 2022-05-25 DIAGNOSIS — F332 Major depressive disorder, recurrent severe without psychotic features: Secondary | ICD-10-CM

## 2022-05-25 DIAGNOSIS — F410 Panic disorder [episodic paroxysmal anxiety] without agoraphobia: Secondary | ICD-10-CM | POA: Diagnosis not present

## 2022-05-25 DIAGNOSIS — E039 Hypothyroidism, unspecified: Secondary | ICD-10-CM

## 2022-05-25 DIAGNOSIS — F988 Other specified behavioral and emotional disorders with onset usually occurring in childhood and adolescence: Secondary | ICD-10-CM | POA: Diagnosis not present

## 2022-05-25 MED ORDER — LISDEXAMFETAMINE DIMESYLATE 40 MG PO CAPS: 40.0000 mg | ORAL_CAPSULE | Freq: Every day | ORAL | 0 refills | Status: DC

## 2022-05-25 NOTE — Progress Notes (Signed)
Subjective:    Patient ID: Shelley Whitehead, female    DOB: 09-24-1991, 31 y.o.   MRN: [id number]   Chief Complaint: medical management of chronic issues     HPI:  Shelley Whitehead is a 31 y.o. who identifies as a female who was assigned female at birth.   Social history: Lives with: by herself Work history: Runner, broadcasting/film/video in [employer] in today for follow up of the following chronic medical issues:  1. Acquired hypothyroidism Is seeing endocrinology. No problems that she is aware of. Lab Results  Component Value Date   TSH 4.460 06/02/2021     2. Severe episode of recurrent major depressive disorder, without psychotic features (HCC) She is currently on no anti depressant. Says she is doing well.    05/25/2022    4:08 PM 12/11/2021   12:41 PM 05/06/2021    3:12 PM  Depression screen PHQ 2/9  Decreased Interest 0 1   Down, Depressed, Hopeless 0 0 0  PHQ - 2 Score 0 1 0  Altered sleeping 0 2   Tired, decreased energy 0 0   Change in appetite 0 0   Feeling bad or failure about yourself  0 0   Trouble concentrating 0 1   Moving slowly or fidgety/restless 0 0   Suicidal thoughts 0 0   PHQ-9 Score 0 4   Difficult doing work/chores Not difficult at all Not difficult at all       3. Panic attacks Is on elavil which also helps her sleep better.    05/25/2022    4:08 PM 12/11/2021   12:42 PM 07/13/2019    3:28 PM  GAD 7 : Generalized Anxiety Score  Nervous, Anxious, on Edge 0 1 3  Control/stop worrying 0 0 3  Worry too much - different things 0 0 3  Trouble relaxing 0 0 3  Restless 0 0 1  Easily annoyed or irritable 0 1 2  Afraid - awful might happen 0 0 3  Total GAD 7 Score 0 2 18  Anxiety Difficulty Not difficult at all Not difficult at all Somewhat difficult      4. Attention deficit disorder (ADD) without hyperactivity She is on vyvanse daily. Is not able to concentrate at work without meds.   New complaints: None today  Allergies  Allergen Reactions    Peanut-Containing Drug Products Swelling   Soy Allergy Hives   Outpatient Encounter Medications as of 05/25/2022  Medication Sig   amitriptyline (ELAVIL) 25 MG tablet TAKE 1 TABLET BY MOUTH EVERYDAY AT BEDTIME   betamethasone valerate ointment (VALISONE) 0.1 % Apply 1 application topically 2 (two) times daily. User for up to 1-2 weeks as needed (Patient taking differently: Apply 1 application. topically 2 (two) times daily as needed (rash).)   ibuprofen (ADVIL) 200 MG tablet Take 400 mg by mouth every 6 (six) hours as needed for headache or moderate pain.   levocetirizine (XYZAL) 5 MG tablet Take 5 mg by mouth daily.   levothyroxine (SYNTHROID) 50 MCG tablet TAKE 1 TABLET BY MOUTH EVERY DAY (Patient taking differently: Take 50 mcg by mouth daily before breakfast.)   lisdexamfetamine (VYVANSE) 40 MG capsule Take 1 capsule (40 mg total) by mouth daily before breakfast. (Patient taking differently: Take 40 mg by mouth See admin instructions. Monday-friday)   norethindrone-ethinyl estradiol-FE (JUNEL FE 1/20) 1-20 MG-MCG tablet Take 1 tablet by mouth daily.   SUMAtriptan (IMITREX) 50 MG tablet Take 1 tablet (50 mg total) by  mouth every 2 (two) hours as needed for migraine. May repeat in 2 hours if headache persists or recurs.   No facility-administered encounter medications on file as of 05/25/2022.    Past Surgical History:  Procedure Laterality Date   polyp removed as child      Family History  Problem Relation Age of Onset   Diabetes Mother    Heart disease Mother    Cervical cancer Mother    Allergic rhinitis Father    Breast cancer Paternal Grandmother    Angioedema Neg Hx    Asthma Neg Hx    Atopy Neg Hx    Urticaria Neg Hx       Controlled substance contract: 12/14/21     Review of Systems  Constitutional:  Negative for diaphoresis.  Eyes:  Negative for pain.  Respiratory:  Negative for shortness of breath.   Cardiovascular:  Negative for chest pain, palpitations and  leg swelling.  Gastrointestinal:  Negative for abdominal pain.  Endocrine: Negative for polydipsia.  Skin:  Negative for rash.  Neurological:  Negative for dizziness, weakness and headaches.  Hematological:  Does not bruise/bleed easily.  All other systems reviewed and are negative.      Objective:   Physical Exam Vitals and nursing note reviewed.  Constitutional:      General: She is not in acute distress.    Appearance: Normal appearance. She is well-developed.  HENT:     Head: Normocephalic.     Right Ear: Tympanic membrane normal.     Left Ear: Tympanic membrane normal.     Nose: Nose normal.     Mouth/Throat:     Mouth: Mucous membranes are moist.  Eyes:     Pupils: Pupils are equal, round, and reactive to light.  Neck:     Vascular: No carotid bruit or JVD.  Cardiovascular:     Rate and Rhythm: Normal rate and regular rhythm.     Heart sounds: Normal heart sounds.  Pulmonary:     Effort: Pulmonary effort is normal. No respiratory distress.     Breath sounds: Normal breath sounds. No wheezing or rales.  Chest:     Chest wall: No tenderness.  Abdominal:     General: Bowel sounds are normal. There is no distension or abdominal bruit.     Palpations: Abdomen is soft. There is no hepatomegaly, splenomegaly, mass or pulsatile mass.     Tenderness: There is no abdominal tenderness.  Musculoskeletal:        General: Normal range of motion.     Cervical back: Normal range of motion and neck supple.  Lymphadenopathy:     Cervical: No cervical adenopathy.  Skin:    General: Skin is warm and dry.  Neurological:     Mental Status: She is alert and oriented to person, place, and time.     Deep Tendon Reflexes: Reflexes are normal and symmetric.  Psychiatric:        Behavior: Behavior normal.        Thought Content: Thought content normal.        Judgment: Judgment normal.   BP 137/85   Pulse 67   Temp 98.3 F (36.8 C) (Temporal)   Resp 20   Ht 5\' 2"  (1.575 m)   Wt  220 lb (99.8 kg)   SpO2 95%   BMI 40.24 kg/m          Assessment & Plan:  Shelley Whitehead comes in today with chief complaint of ADHD  Diagnosis and orders addressed:  1. Acquired hypothyroidism Keep followup with endocrinology  2. Severe episode of recurrent major depressive disorder, without psychotic features (HCC) Stress management  3. Panic attacks  4. Attention deficit disorder (ADD) without hyperactivity - lisdexamfetamine (VYVANSE) 40 MG capsule; Take 1 capsule (40 mg total) by mouth daily before breakfast.  Dispense: 30 capsule; Refill: 0 - lisdexamfetamine (VYVANSE) 40 MG capsule; Take 1 capsule (40 mg total) by mouth daily before breakfast.  Dispense: 30 capsule; Refill: 0 - lisdexamfetamine (VYVANSE) 40 MG capsule; Take 1 capsule (40 mg total) by mouth daily before breakfast.  Dispense: 30 capsule; Refill: 0   Labs pending Health Maintenance reviewed Diet and exercise encouraged  Follow up plan: 3 months   Mary-Margaret Daphine Deutscher, FNP

## 2022-05-25 NOTE — Patient Instructions (Signed)

## 2022-06-15 ENCOUNTER — Telehealth: Payer: Self-pay | Admitting: Neurology

## 2022-06-15 NOTE — Telephone Encounter (Signed)
LVM and sent mychart msg informing pt of r/s needed for 9/25 appt- MD out.

## 2022-07-08 ENCOUNTER — Ambulatory Visit: Payer: BC Managed Care – PPO | Admitting: Nurse Practitioner

## 2022-07-13 ENCOUNTER — Ambulatory Visit: Payer: BC Managed Care – PPO | Admitting: Nurse Practitioner

## 2022-07-19 ENCOUNTER — Ambulatory Visit: Payer: BC Managed Care – PPO | Admitting: Neurology

## 2022-08-26 ENCOUNTER — Ambulatory Visit: Payer: BC Managed Care – PPO | Admitting: Nurse Practitioner

## 2022-09-03 ENCOUNTER — Ambulatory Visit: Payer: BC Managed Care – PPO | Admitting: Nurse Practitioner

## 2022-09-03 ENCOUNTER — Encounter: Payer: Self-pay | Admitting: Nurse Practitioner

## 2022-09-03 VITALS — BP 114/70 | HR 58 | Temp 98.0°F | Resp 20 | Ht 62.0 in | Wt 221.0 lb

## 2022-09-03 DIAGNOSIS — F332 Major depressive disorder, recurrent severe without psychotic features: Secondary | ICD-10-CM

## 2022-09-03 DIAGNOSIS — F410 Panic disorder [episodic paroxysmal anxiety] without agoraphobia: Secondary | ICD-10-CM

## 2022-09-03 DIAGNOSIS — F988 Other specified behavioral and emotional disorders with onset usually occurring in childhood and adolescence: Secondary | ICD-10-CM

## 2022-09-03 DIAGNOSIS — E039 Hypothyroidism, unspecified: Secondary | ICD-10-CM | POA: Diagnosis not present

## 2022-09-03 MED ORDER — SYNTHROID 50 MCG PO TABS: 50.0000 ug | ORAL_TABLET | Freq: Every day | ORAL | 1 refills | Status: DC

## 2022-09-03 MED ORDER — LISDEXAMFETAMINE DIMESYLATE 40 MG PO CAPS: 40.0000 mg | ORAL_CAPSULE | Freq: Every day | ORAL | 0 refills | Status: DC

## 2022-09-03 MED ORDER — LEVOTHYROXINE SODIUM 50 MCG PO TABS: 50.0000 ug | ORAL_TABLET | Freq: Every day | ORAL | 1 refills | Status: DC

## 2022-09-03 NOTE — Progress Notes (Addendum)
Subjective:    Patient ID: Shelley Whitehead, female    DOB: 1991/09/07, 31 y.o.   MRN: [id number]    Chief Complaint: medical management of chronic issues     HPI:  Shelley Whitehead is a 31 y.o. who identifies as a female who was assigned female at birth.   Social history: Lives with: by herself Work history: school Pharmacist, hospital in [place]   Comes in today for follow up of the following chronic medical issues:  1. Acquired hypothyroidism No issues that she is aware of. She missed several month sof meds and had no migraines while she was off of meds. When she started taking it again she started having migraines again. Lab Results  Component Value Date   TSH 4.460 06/02/2021     2. Attention deficit disorder (ADD) without hyperactivity Patient has been on vyvanse for many years . Is doing well. No medication side effects,  3. Panic attacks 4. Severe episode of recurrent major depressive disorder, without psychotic features ([place]) She denies any recent panic attacks. Is on  no prescription medications right now. Thinks she is doing well without.    09/03/2022    9:46 AM 05/25/2022    4:08 PM 12/11/2021   12:41 PM  Depression screen PHQ 2/9  Decreased Interest 0 0 1  Down, Depressed, Hopeless 0 0 0  PHQ - 2 Score 0 0 1  Altered sleeping 0 0 2  Tired, decreased energy 0 0 0  Change in appetite 0 0 0  Feeling bad or failure about yourself  0 0 0  Trouble concentrating 0 0 1  Moving slowly or fidgety/restless 0 0 0  Suicidal thoughts 0 0 0  PHQ-9 Score 0 0 4  Difficult doing work/chores Not difficult at all Not difficult at all Not difficult at all   BMI Readings from Last 3 Encounters:  09/03/22 40.42 kg/m  05/25/22 40.24 kg/m  04/01/22 38.41 kg/m     New complaints: None today  Allergies  Allergen Reactions   Peanut-Containing Drug Products Swelling   Soy Allergy Hives   Outpatient Encounter Medications as of 09/03/2022  Medication Sig   betamethasone  valerate ointment (VALISONE) 0.1 % Apply 1 application topically 2 (two) times daily. User for up to 1-2 weeks as needed (Patient taking differently: Apply 1 application  topically 2 (two) times daily as needed (rash).)   ibuprofen (ADVIL) 200 MG tablet Take 400 mg by mouth every 6 (six) hours as needed for headache or moderate pain.   levocetirizine (XYZAL) 5 MG tablet Take 5 mg by mouth daily.   levothyroxine (SYNTHROID) 50 MCG tablet TAKE 1 TABLET BY MOUTH EVERY DAY (Patient taking differently: Take 50 mcg by mouth daily before breakfast.)   lisdexamfetamine (VYVANSE) 40 MG capsule Take 1 capsule (40 mg total) by mouth daily before breakfast.   lisdexamfetamine (VYVANSE) 40 MG capsule Take 1 capsule (40 mg total) by mouth daily before breakfast.   lisdexamfetamine (VYVANSE) 40 MG capsule Take 1 capsule (40 mg total) by mouth daily before breakfast.   norethindrone-ethinyl estradiol-FE (JUNEL FE 1/20) 1-20 MG-MCG tablet Take 1 tablet by mouth daily.   SUMAtriptan (IMITREX) 50 MG tablet Take 1 tablet (50 mg total) by mouth every 2 (two) hours as needed for migraine. May repeat in 2 hours if headache persists or recurs.   No facility-administered encounter medications on file as of 09/03/2022.    Past Surgical History:  Procedure Laterality Date   polyp removed as child  Family History  Problem Relation Age of Onset   Diabetes Mother    Heart disease Mother    Cervical cancer Mother    Allergic rhinitis Father    Breast cancer Paternal Grandmother    Angioedema Neg Hx    Asthma Neg Hx    Atopy Neg Hx    Urticaria Neg Hx       Controlled substance contract: n/a      Review of Systems  Constitutional:  Negative for diaphoresis.  Eyes:  Negative for pain.  Respiratory:  Negative for shortness of breath.   Cardiovascular:  Negative for chest pain, palpitations and leg swelling.  Gastrointestinal:  Negative for abdominal pain.  Endocrine: Negative for polydipsia.  Skin:   Negative for rash.  Neurological:  Negative for dizziness, weakness and headaches.  Hematological:  Does not bruise/bleed easily.  All other systems reviewed and are negative.      Objective:   Physical Exam Vitals and nursing note reviewed.  Constitutional:      General: She is not in acute distress.    Appearance: Normal appearance. She is well-developed.  HENT:     Head: Normocephalic.     Right Ear: Tympanic membrane normal.     Left Ear: Tympanic membrane normal.     Nose: Nose normal.     Mouth/Throat:     Mouth: Mucous membranes are moist.  Eyes:     Pupils: Pupils are equal, round, and reactive to light.  Neck:     Vascular: No carotid bruit or JVD.  Cardiovascular:     Rate and Rhythm: Normal rate and regular rhythm.     Heart sounds: Normal heart sounds.  Pulmonary:     Effort: Pulmonary effort is normal. No respiratory distress.     Breath sounds: Normal breath sounds. No wheezing or rales.  Chest:     Chest wall: No tenderness.  Abdominal:     General: Bowel sounds are normal. There is no distension or abdominal bruit.     Palpations: Abdomen is soft. There is no hepatomegaly, splenomegaly, mass or pulsatile mass.     Tenderness: There is no abdominal tenderness.  Musculoskeletal:        General: Normal range of motion.     Cervical back: Normal range of motion and neck supple.  Lymphadenopathy:     Cervical: No cervical adenopathy.  Skin:    General: Skin is warm and dry.  Neurological:     Mental Status: She is alert and oriented to person, place, and time.     Deep Tendon Reflexes: Reflexes are normal and symmetric.  Psychiatric:        Behavior: Behavior normal.        Thought Content: Thought content normal.        Judgment: Judgment normal.     BP 114/70   Pulse (!) 58   Temp 98 F (36.7 C) (Temporal)   Resp 20   Ht _0  (1.575 m)   Wt 221 lb (100.2 kg)   SpO2 100%   BMI 40.42 kg/m        Assessment & Plan:   Shelley Whitehead comes  in today with chief complaint of ADHD   Diagnosis and orders addressed:  1. Acquired hypothyroidism Labs pending Changed to brand name synthroid - levothyroxine (SYNTHROID) 50 MCG tablet; Take 1 tablet (50 mcg total) by mouth daily.  Dispense: 90 tablet; Refill: 1 - CBC with Differential/Platelet - CMP14+EGFR - Thyroid Panel With  TSH  2. Attention deficit disorder (ADD) without hyperactivity Stress managemnt - lisdexamfetamine (VYVANSE) 40 MG capsule; Take 1 capsule (40 mg total) by mouth daily before breakfast.  Dispense: 30 capsule; Refill: 0 - lisdexamfetamine (VYVANSE) 40 MG capsule; Take 1 capsule (40 mg total) by mouth daily before breakfast.  Dispense: 30 capsule; Refill: 0 - lisdexamfetamine (VYVANSE) 40 MG capsule; Take 1 capsule (40 mg total) by mouth daily before breakfast.  Dispense: 30 capsule; Refill: 0  3. Panic attacks 4. Severe episode of recurrent major depressive disorder, without psychotic features ([place]) Stress manaegment   Labs pending Health Maintenance reviewed Diet and exercise encouraged  Follow up plan: 3 months   Mary-Margaret Hassell Done, FNP

## 2022-09-03 NOTE — Addendum Note (Signed)
Addended by: Bennie Pierini on: 09/03/2022 10:09 AM   Modules accepted: Orders

## 2022-12-06 ENCOUNTER — Encounter: Payer: Self-pay | Admitting: Nurse Practitioner

## 2022-12-06 ENCOUNTER — Ambulatory Visit: Payer: BC Managed Care – PPO | Admitting: Obstetrics and Gynecology

## 2022-12-06 ENCOUNTER — Telehealth (INDEPENDENT_AMBULATORY_CARE_PROVIDER_SITE_OTHER): Payer: BC Managed Care – PPO | Admitting: Nurse Practitioner

## 2022-12-06 DIAGNOSIS — F988 Other specified behavioral and emotional disorders with onset usually occurring in childhood and adolescence: Secondary | ICD-10-CM | POA: Diagnosis not present

## 2022-12-06 MED ORDER — VYVANSE 40 MG PO CAPS: 40.0000 mg | ORAL_CAPSULE | Freq: Every day | ORAL | 0 refills | Status: DC

## 2022-12-06 NOTE — Progress Notes (Signed)
Virtual Visit Consent   Shelley Whitehead, you are scheduled for a virtual visit with Mary-Margaret Hassell Done, [place], a [facility] provider, today.     Just as with appointments in the office, your consent must be obtained to participate.  Your consent will be active for this visit and any virtual visit you may have with one of our providers in the next 365 days.     If you have a MyChart account, a copy of this consent can be sent to you electronically.  All virtual visits are billed to your insurance company just like a traditional visit in the office.    As this is a virtual visit, video technology does not allow for your provider to perform a traditional examination.  This may limit your provider's ability to fully assess your condition.  If your provider identifies any concerns that need to be evaluated in person or the need to arrange testing (such as labs, EKG, etc.), we will make arrangements to do so.     Although advances in technology are sophisticated, we cannot ensure that it will always work on either your end or our end.  If the connection with a video visit is poor, the visit may have to be switched to a telephone visit.  With either a video or telephone visit, we are not always able to ensure that we have a secure connection.     I need to obtain your verbal consent now.   Are you willing to proceed with your visit today? YES   Jaasritha Stracke has provided verbal consent on 12/06/2022 for a virtual visit (video or telephone).   Mary-Margaret Hassell Done, FNP   Date: 12/06/2022 3:02 PM   Virtual Visit via Video Note   I, Mary-Margaret Hassell Done, connected with Shelley Whitehead ([id number], Jul 31, 1991) on 12/06/22 at  3:00 PM EST by a video-enabled telemedicine application and verified that I am speaking with the correct person using two identifiers.  Location: Patient: Virtual Visit Location Patient: Home Provider: Virtual Visit Location Provider: Mobile   I discussed the limitations of  evaluation and management by telemedicine and the availability of in person appointments. The patient expressed understanding and agreed to proceed.    History of Present Illness: Shelley Whitehead is a 32 y.o. who identifies as a female who was assigned female at birth, and is being seen today for adhd.  HPI: . Patient has been on vyvanse  for several years. They have been giving her the generic and it has not been working as well for her. She would like to try brand name   [place] CSRS reviewed: Yes Any suspicious activity on [place] Csrs: No  Contract signed: 12/14/21  Review of Systems  Constitutional:  Negative for diaphoresis and weight loss.  Eyes:  Negative for blurred vision, double vision and pain.  Respiratory:  Negative for shortness of breath.   Cardiovascular:  Negative for chest pain, palpitations, orthopnea and leg swelling.  Gastrointestinal:  Negative for abdominal pain.  Skin:  Negative for rash.  Neurological:  Negative for dizziness, sensory change, loss of consciousness, weakness and headaches.  Endo/Heme/Allergies:  Negative for polydipsia. Does not bruise/bleed easily.  Psychiatric/Behavioral:  Negative for memory loss. The patient does not have insomnia.   All other systems reviewed and are negative.   Problems:  Patient Active Problem List   Diagnosis Date Noted   Other headache syndrome 05/06/2021   Panic attacks 02/13/2019   Acquired hypothyroidism 12/01/2018   ADD (attention deficit disorder)  07/27/2013   Depression 01/14/2011    Allergies:  Allergies  Allergen Reactions   Peanut-Containing Drug Products Swelling   Soy Allergy Hives   Medications:  Current Outpatient Medications:    betamethasone valerate ointment (VALISONE) 0.1 %, Apply 1 application topically 2 (two) times daily. User for up to 1-2 weeks as needed (Patient taking differently: Apply 1 application  topically 2 (two) times daily as needed (rash).), Disp: 30 g, Rfl: 0   ibuprofen (ADVIL) 200  MG tablet, Take 400 mg by mouth every 6 (six) hours as needed for headache or moderate pain., Disp: , Rfl:    levocetirizine (XYZAL) 5 MG tablet, Take 5 mg by mouth daily., Disp: , Rfl:    levothyroxine (SYNTHROID) 50 MCG tablet, Take 1 tablet (50 mcg total) by mouth daily., Disp: 90 tablet, Rfl: 1   lisdexamfetamine (VYVANSE) 40 MG capsule, Take 1 capsule (40 mg total) by mouth daily before breakfast., Disp: 30 capsule, Rfl: 0   lisdexamfetamine (VYVANSE) 40 MG capsule, Take 1 capsule (40 mg total) by mouth daily before breakfast., Disp: 30 capsule, Rfl: 0   lisdexamfetamine (VYVANSE) 40 MG capsule, Take 1 capsule (40 mg total) by mouth daily before breakfast., Disp: 30 capsule, Rfl: 0   norethindrone-ethinyl estradiol-FE (JUNEL FE 1/20) 1-20 MG-MCG tablet, Take 1 tablet by mouth daily. (Patient not taking: Reported on 09/03/2022), Disp: 84 tablet, Rfl: 3   SUMAtriptan (IMITREX) 50 MG tablet, Take 1 tablet (50 mg total) by mouth every 2 (two) hours as needed for migraine. May repeat in 2 hours if headache persists or recurs. (Patient not taking: Reported on 09/03/2022), Disp: 10 tablet, Rfl: 3   SYNTHROID 50 MCG tablet, Take 1 tablet (50 mcg total) by mouth daily before breakfast., Disp: 90 tablet, Rfl: 1  Observations/Objective: Patient is well-developed, well-nourished in no acute distress.  Resting comfortably  at home.  Head is normocephalic, atraumatic.  No labored breathing.  Speech is clear and coherent with logical content.  Patient is alert and oriented at baseline.    Assessment and Plan:  Shelley Whitehead in today with chief complaint of ADHD   1. Attention deficit disorder (ADD) without hyperactivity Stres managemnet May need prior auth for brand name vyvanse - VYVANSE 40 MG capsule; Take 1 capsule (40 mg total) by mouth daily before breakfast.  Dispense: 30 capsule; Refill: 0 - VYVANSE 40 MG capsule; Take 1 capsule (40 mg total) by mouth daily before breakfast.  Dispense: 30  capsule; Refill: 0 - VYVANSE 40 MG capsule; Take 1 capsule (40 mg total) by mouth daily before breakfast.  Dispense: 30 capsule; Refill: 0     Follow Up Instructions: I discussed the assessment and treatment plan with the patient. The patient was provided an opportunity to ask questions and all were answered. The patient agreed with the plan and demonstrated an understanding of the instructions.  A copy of instructions were sent to the patient via MyChart.  The patient was advised to call back or seek an in-person evaluation if the symptoms worsen or if the condition fails to improve as anticipated.  Time:  I spent 9 minutes with the patient via telehealth technology discussing the above problems/concerns.    Mary-Margaret Hassell Done, FNP

## 2022-12-09 DIAGNOSIS — F988 Other specified behavioral and emotional disorders with onset usually occurring in childhood and adolescence: Secondary | ICD-10-CM

## 2022-12-10 MED ORDER — LISDEXAMFETAMINE DIMESYLATE 40 MG PO CAPS: 40.0000 mg | ORAL_CAPSULE | Freq: Every day | ORAL | 0 refills | Status: DC

## 2022-12-10 NOTE — Telephone Encounter (Signed)
Generic vyvanse sent to pharmacy  Meds ordered this encounter  Medications   lisdexamfetamine (VYVANSE) 40 MG capsule    Sig: Take 1 capsule (40 mg total) by mouth daily before breakfast.    Dispense:  30 capsule    Refill:  0    Order Specific Question:   Supervising Provider    Answer:   Caryl Pina A [id number]   lisdexamfetamine (VYVANSE) 40 MG capsule    Sig: Take 1 capsule (40 mg total) by mouth daily before breakfast.    Dispense:  30 capsule    Refill:  0    Order Specific Question:   Supervising Provider    Answer:   Caryl Pina A [id number]   lisdexamfetamine (VYVANSE) 40 MG capsule    Sig: Take 1 capsule (40 mg total) by mouth daily before breakfast.    Dispense:  30 capsule    Refill:  0    Order Specific Question:   Supervising Provider    Answer:   Worthy Rancher [id number]   [place], FNP

## 2023-01-10 NOTE — Progress Notes (Signed)
32 y.o. G0P0000 Single White or Caucasian Not Hispanic or Latino female here for annual exam.  Her boyfriend recently moved in with her, together x 2 years.  Period Cycle (Days): 28 Period Duration (Days): 4 Period Pattern: Regular Menstrual Flow: Moderate Menstrual Control: Tampon Menstrual Control Change Freq (Hours): 4-6 Dysmenorrhea: (!) Severe Dysmenorrhea Symptoms: Cramping, Headache, Nausea She states that she had two periods last month. She states that there was a week between.  Using rhythm for contraception, w/d and occasional condoms. She didn't get pregnant with her ex after 1.5 years of unprotected intercourse. No h/o pelvic infections.   She can't get her vyvanse secondary to a shortage of the generic (the brand name is too expensive).   She requests her TSH. She is on synthroid (had headaches with generic synthroid).   Patient's last menstrual period was 01/04/2023.          Sexually active: Yes.    The current method of family planning is coitus interruptus and avoids ovulation.    Exercising: Yes.     3x week cardio  Smoker:  no  Health Maintenance: Pap:  12/02/21 WNL Hr HPV Neg;  11-23-2018 negative  History of abnormal Pap:  no MMG:  n/a BMD:   n/a Colonoscopy: n/a TDaP:  11/23/2018 Gardasil: complete x 3    reports that she has never smoked. She has never used smokeless tobacco. She reports current alcohol use of about 2.0 - 3.0 standard drinks of alcohol per week. She reports current drug use. Drug: Marijuana. Elementary school teacher, 3rd grade.    Past Medical History:  Diagnosis Date   ADD (attention deficit disorder)    Anxiety    Dysmenorrhea    Thyroid disease    hypothyroid    Past Surgical History:  Procedure Laterality Date   polyp removed as child      Current Outpatient Medications  Medication Sig Dispense Refill   betamethasone valerate ointment (VALISONE) 0.1 % Apply 1 application topically 2 (two) times daily. User for up to 1-2 weeks  as needed (Patient taking differently: Apply 1 application  topically 2 (two) times daily as needed (rash).) 30 g 0   ibuprofen (ADVIL) 200 MG tablet Take 400 mg by mouth every 6 (six) hours as needed for headache or moderate pain.     levocetirizine (XYZAL) 5 MG tablet Take 5 mg by mouth daily.     levothyroxine (SYNTHROID) 50 MCG tablet Take 1 tablet (50 mcg total) by mouth daily. 90 tablet 1   lisdexamfetamine (VYVANSE) 40 MG capsule Take 1 capsule (40 mg total) by mouth daily before breakfast. 30 capsule 0   lisdexamfetamine (VYVANSE) 40 MG capsule Take 1 capsule (40 mg total) by mouth daily before breakfast. (Patient not taking: Reported on 01/18/2023) 30 capsule 0   [START ON 02/04/2023] lisdexamfetamine (VYVANSE) 40 MG capsule Take 1 capsule (40 mg total) by mouth daily before breakfast. (Patient not taking: Reported on 01/18/2023) 30 capsule 0   No current facility-administered medications for this visit.    Family History  Problem Relation Age of Onset   Diabetes Mother    Heart disease Mother    Cervical cancer Mother    Allergic rhinitis Father    Breast cancer Paternal Grandmother    Angioedema Neg Hx    Asthma Neg Hx    Atopy Neg Hx    Urticaria Neg Hx     Review of Systems  All other systems reviewed and are negative.   Exam:  BP 118/74   Pulse 76   Ht 5\' 2"  (1.575 m)   Wt 215 lb (97.5 kg)   LMP 01/04/2023   SpO2 100%   BMI 39.32 kg/m   Weight change: @WEIGHTCHANGE @ Height:   Height: 5\' 2"  (157.5 cm)  Ht Readings from Last 3 Encounters:  01/18/23 5\' 2"  (1.575 m)  09/03/22 5\' 2"  (1.575 m)  05/25/22 5\' 2"  (1.575 m)    General appearance: alert, cooperative and appears stated age Head: Normocephalic, without obvious abnormality, atraumatic Neck: no adenopathy, supple, symmetrical, trachea midline and thyroid normal to inspection and palpation Lungs: clear to auscultation bilaterally Cardiovascular: regular rate and rhythm Breasts: normal appearance, no masses  or tenderness Abdomen: soft, non-tender; non distended,  no masses,  no organomegaly Extremities: extremities normal, atraumatic, no cyanosis or edema Skin: Skin color, texture, turgor normal. No rashes or lesions Lymph nodes: Cervical, supraclavicular, and axillary nodes normal. No abnormal inguinal nodes palpated Neurologic: Grossly normal   Pelvic: External genitalia:  no lesions              Urethra:  normal appearing urethra with no masses, tenderness or lesions              Bartholins and Skenes: normal                 Vagina: normal appearing vagina with normal color and discharge, no lesions              Cervix: no lesions               Bimanual Exam:  Uterus:   no masses or tenderness              Adnexa: no mass, fullness, tenderness               Rectovaginal: Confirms               Anus:  normal sphincter tone, no lesions  Gae Dry, CMA chaperoned for the exam.  1. Well woman exam Discussed breast self exam Discussed calcium and vit D intake No pap this year Start PNV, incase she gets pregnant. Would recommend early monitoring when she is pregnant to r/o ectopic  2. Irregular menses X 1 - Pregnancy, urine: negative  3. Acquired hypothyroidism - TSH (remembers her synthroid 5 out of 7 days).

## 2023-01-18 ENCOUNTER — Ambulatory Visit (INDEPENDENT_AMBULATORY_CARE_PROVIDER_SITE_OTHER): Payer: BC Managed Care – PPO | Admitting: Obstetrics and Gynecology

## 2023-01-18 ENCOUNTER — Encounter: Payer: Self-pay | Admitting: Obstetrics and Gynecology

## 2023-01-18 VITALS — BP 118/74 | HR 76 | Ht 62.0 in | Wt 215.0 lb

## 2023-01-18 DIAGNOSIS — E039 Hypothyroidism, unspecified: Secondary | ICD-10-CM

## 2023-01-18 DIAGNOSIS — N926 Irregular menstruation, unspecified: Secondary | ICD-10-CM | POA: Diagnosis not present

## 2023-01-18 DIAGNOSIS — Z01419 Encounter for gynecological examination (general) (routine) without abnormal findings: Secondary | ICD-10-CM | POA: Diagnosis not present

## 2023-01-18 LAB — PREGNANCY, URINE: Preg Test, Ur: NEGATIVE

## 2023-01-18 LAB — TSH: TSH: 9.87 mIU/L — ABNORMAL HIGH

## 2023-01-18 NOTE — Patient Instructions (Signed)

## 2023-01-24 ENCOUNTER — Telehealth: Payer: Self-pay | Admitting: Nurse Practitioner

## 2023-01-24 DIAGNOSIS — F988 Other specified behavioral and emotional disorders with onset usually occurring in childhood and adolescence: Secondary | ICD-10-CM

## 2023-01-24 MED ORDER — LISDEXAMFETAMINE DIMESYLATE 40 MG PO CAPS: 40.0000 mg | ORAL_CAPSULE | Freq: Every day | ORAL | 0 refills | Status: DC

## 2023-01-24 NOTE — Telephone Encounter (Signed)
Left message making pt aware and to call back if needed. 

## 2023-01-24 NOTE — Telephone Encounter (Signed)
Please advise 

## 2023-01-24 NOTE — Telephone Encounter (Signed)
Vyvanse sent to [facility] [address]

## 2023-01-26 ENCOUNTER — Telehealth: Payer: Self-pay

## 2023-01-26 NOTE — Telephone Encounter (Signed)
Shelley Whitehead ([name]) PA Case ID #: [id number] Rx #: [id number] Need Help? Call us at [phone] Status sent iconSent to Plan today Drug Lisdexamfetamine Dimesylate 40MG  capsules ePA cloud Child psychotherapist Electronic PA Form [phone] NCPDP) Original Claim Info [place] [facility] CALL [phone].DRUG REQUIRES PRIOR AUTHORIZATION

## 2023-03-10 ENCOUNTER — Ambulatory Visit: Payer: BC Managed Care – PPO | Admitting: Nurse Practitioner

## 2023-03-10 VITALS — BP 117/69 | HR 73 | Temp 97.8°F | Ht 62.0 in | Wt 215.2 lb

## 2023-03-10 DIAGNOSIS — F988 Other specified behavioral and emotional disorders with onset usually occurring in childhood and adolescence: Secondary | ICD-10-CM | POA: Diagnosis not present

## 2023-03-10 DIAGNOSIS — F410 Panic disorder [episodic paroxysmal anxiety] without agoraphobia: Secondary | ICD-10-CM | POA: Diagnosis not present

## 2023-03-10 DIAGNOSIS — F332 Major depressive disorder, recurrent severe without psychotic features: Secondary | ICD-10-CM

## 2023-03-10 MED ORDER — VYVANSE 40 MG PO CAPS
40.0000 mg | ORAL_CAPSULE | Freq: Every day | ORAL | 0 refills | Status: DC
Start: 2023-05-09 — End: 2023-12-17

## 2023-03-10 MED ORDER — VYVANSE 40 MG PO CAPS: 40.0000 mg | ORAL_CAPSULE | Freq: Every day | ORAL | 0 refills | Status: DC

## 2023-03-10 NOTE — Progress Notes (Signed)
Subjective:    Patient ID: Shelley Whitehead, female    DOB: Jan 18, 1991, 32 y.o.   MRN: [id number]   Chief Complaint: medical management of chronic issues     HPI:  Shelley Whitehead is a 32 y.o. who identifies as a female who was assigned female at birth.   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 Attention deficit disorder (ADD) without hyperactivity Patient has been on ADHD meds for many years. Is not able to concentrate and complete work without meds. Is currently on vyvanse 40mg  daily. No medication side effects.  Control contract: 03/10/23- drug screen done today  2. Severe episode of recurrent major depressive disorder, without psychotic features (HCC) 3. Panic attacks Has been doing quite well. On no antidepressant.    09/03/2022    9:46 AM 05/25/2022    4:08 PM 12/11/2021   12:41 PM  Depression screen PHQ 2/9  Decreased Interest 0 0 1  Down, Depressed, Hopeless 0 0 0  PHQ - 2 Score 0 0 1  Altered sleeping 0 0 2  Tired, decreased energy 0 0 0  Change in appetite 0 0 0  Feeling bad or failure about yourself  0 0 0  Trouble concentrating 0 0 1  Moving slowly or fidgety/restless 0 0 0  Suicidal thoughts 0 0 0  PHQ-9 Score 0 0 4  Difficult doing work/chores Not difficult at all Not difficult at all Not difficult at all      09/03/2022    9:46 AM 05/25/2022    4:08 PM 12/11/2021   12:42 PM 07/13/2019    3:28 PM  GAD 7 : Generalized Anxiety Score  Nervous, Anxious, on Edge 2 0 1 3  Control/stop worrying 1 0 0 3  Worry too much - different things 1 0 0 3  Trouble relaxing 0 0 0 3  Restless 0 0 0 1  Easily annoyed or irritable 1 0 1 2  Afraid - awful might happen 1 0 0 3  Total GAD 7 Score 6 0 2 18  Anxiety Difficulty Somewhat difficult Not difficult at all Not difficult at all Somewhat difficult       New complaints: None today  Allergies   Allergen Reactions   Peanut-Containing Drug Products Swelling   Soy Allergy Hives   Outpatient Encounter Medications as of 03/10/2023  Medication Sig   betamethasone valerate ointment (VALISONE) 0.1 % Apply 1 application topically 2 (two) times daily. User for up to 1-2 weeks as needed (Patient taking differently: Apply 1 application  topically 2 (two) times daily as needed (rash).)   ibuprofen (ADVIL) 200 MG tablet Take 400 mg by mouth every 6 (six) hours as needed for headache or moderate pain.   levocetirizine (XYZAL) 5 MG tablet Take 5 mg by mouth daily.   levothyroxine (SYNTHROID) 50 MCG tablet Take 1 tablet (50 mcg total) by mouth daily.   lisdexamfetamine (VYVANSE) 40 MG capsule Take 1 capsule (40 mg total) by mouth daily before breakfast.   lisdexamfetamine (VYVANSE) 40 MG capsule Take 1 capsule (40 mg total) by mouth daily before breakfast. (Patient not taking: Reported on 01/18/2023)   lisdexamfetamine (VYVANSE) 40 MG capsule Take 1 capsule (40 mg total) by mouth daily before breakfast.   No facility-administered encounter medications on file as of 03/10/2023.    Past Surgical History:  Procedure Laterality Date   polyp  removed as child      Family History  Problem Relation Age of Onset   Diabetes Mother    Heart disease Mother    Cervical cancer Mother    Allergic rhinitis Father    Breast cancer Paternal Grandmother    Angioedema Neg Hx    Asthma Neg Hx    Atopy Neg Hx    Urticaria Neg Hx       Controlled substance contract: n/a     Review of Systems  Constitutional:  Negative for diaphoresis.  Eyes:  Negative for pain.  Respiratory:  Negative for shortness of breath.   Cardiovascular:  Negative for chest pain, palpitations and leg swelling.  Gastrointestinal:  Negative for abdominal pain.  Endocrine: Negative for polydipsia.  Skin:  Negative for rash.  Neurological:  Negative for dizziness, weakness and headaches.  Hematological:  Does not bruise/bleed  easily.  All other systems reviewed and are negative.      Objective:   Physical Exam Vitals reviewed.  Constitutional:      Appearance: Normal appearance. She is obese.  Cardiovascular:     Rate and Rhythm: Normal rate and regular rhythm.     Heart sounds: Normal heart sounds.  Pulmonary:     Effort: Pulmonary effort is normal.     Breath sounds: Normal breath sounds.  Skin:    General: Skin is warm.  Neurological:     General: No focal deficit present.     Mental Status: She is alert and oriented to person, place, and time.  Psychiatric:        Mood and Affect: Mood normal.        Behavior: Behavior normal.    BP 117/69   Pulse 73   Temp 97.8 F (36.6 C) (Temporal)   Ht 5\' 2"  (1.575 m)   Wt 215 lb 3.2 oz (97.6 kg)   SpO2 99%   BMI 39.36 kg/m         Assessment & Plan:  Shelley Whitehead in today with chief complaint of ADHD   1. Attention deficit disorder (ADD) without hyperactivity Stress management - VYVANSE 40 MG capsule; Take 1 capsule (40 mg total) by mouth daily before breakfast.  Dispense: 30 capsule; Refill: 0 - VYVANSE 40 MG capsule; Take 1 capsule (40 mg total) by mouth daily before breakfast.  Dispense: 30 capsule; Refill: 0 - VYVANSE 40 MG capsule; Take 1 capsule (40 mg total) by mouth daily before breakfast.  Dispense: 30 capsule; Refill: 0 - ToxASSURE Select 13 (MW), Urine  2. Severe episode of recurrent major depressive disorder, without psychotic features (HCC)  3. Panic attacks    The above assessment and management plan was discussed with the patient. The patient verbalized understanding of and has agreed to the management plan. Patient is aware to call the clinic if symptoms persist or worsen. Patient is aware when to return to the clinic for a follow-up visit. Patient educated on when it is appropriate to go to the emergency department.   Mary-Margaret Daphine Deutscher, FNP

## 2023-06-07 ENCOUNTER — Ambulatory Visit: Payer: BC Managed Care – PPO | Admitting: Nurse Practitioner

## 2023-06-07 ENCOUNTER — Encounter: Payer: Self-pay | Admitting: Nurse Practitioner

## 2023-06-07 VITALS — BP 132/78 | HR 90 | Temp 97.2°F | Resp 20 | Ht 62.0 in | Wt 228.0 lb

## 2023-06-07 DIAGNOSIS — E039 Hypothyroidism, unspecified: Secondary | ICD-10-CM | POA: Diagnosis not present

## 2023-06-07 DIAGNOSIS — F988 Other specified behavioral and emotional disorders with onset usually occurring in childhood and adolescence: Secondary | ICD-10-CM | POA: Diagnosis not present

## 2023-06-07 NOTE — Patient Instructions (Signed)
Prenatal Multivitamin Oral Dosage Forms What is this medication? PRENATAL MULTIVITAMINS (pree NAY tuhl MUHL tee VAHY tuh mins) support your overall health. They work by increasing the amount of vitamins, minerals, and other nutrients in your body. They are used in combination with a healthy diet. This medicine may be used for other purposes; ask your health care provider or pharmacist if you have questions. COMMON BRAND NAME(S): BP FoliNatal Plus B, BP MultiNatal Plus, BP MultiNatal Plus Chewable, BP Prenate, Calcium PNV, CareNatal DHA, Cavan Prenatal with EC Calcium, Cavan-Alpha, Cavan-EC SOD DHA, Cavan-Heme OB, Cavan-Heme Omega, Centrum Specialist Prenatal, CertaVite with Antioxidants, Choice-OB + DHA, Citracal Prenatal + DHA, CitraNatal 90 DHA, CitraNatal Assure, CitraNatal DHA, CitraNatal Essence DHA, CitraNatal Harmony, ComBi Rx, Complete Natal DHA, Concept DHA, CoreNate-DHA, CRNatal DHA, Duet, Duet Chewable, Duet DHA, Duet DHA 400, Duet DHA 430ec, Duet DHA Balanced, Duet DHA Complete, Duet DHA EC, Duet DHA Ferrazone, EC Omega-3, Elite OB with DHA, Femecal OB Plus DHA, Focalgin 90 DHA, Focalgin CA, Folbecal, Folivane-EC Calcium DHA NF, Foltabs 90 Plus DHA, Foltabs Prenatal, Foltabs Prenatal Plus DHA, Gesticare, Gesticare DHA, Gesticare DHA Delayed-Release, HemeNatal OB + DHA, HIP Prenatal, iNatal Advance, Infanate Balance, Infanate DHA, Infanate Plus, Integra Plus, Kolnatal, Lactocal-F, Levomefolate PNV, MACNATAL CN DHA, Marnatal-F Plus, Maxinate, Mom's Choice Rx, Mteryti, Mteryti Folic, Multi-Nate 30, Multi-Nate 30 DHA, Multi-Nate DHA Extra, Multifol Plus, NataChew, NataFolic-OB, Natal-V RX, NatalCare PIC Forte, NatalCare Plus, NATALVIRT 90 DHA, NATALVIRT CA, Natatab Rx, Natelle C, Natelle One, Natelle Plus with DHA, Navatab + DHA, Neevo, Nestabs ABC, Nestabs DHA, Niva-Plus, NovaNatal, Nutri-Tab OB + DHA, O-Cal Prenatal, OB Complete Petite, OB Complete Premier, OB Complete with DHA, Obtrex DHA, One-A-Day  Women's, One-A-Day Women's Prenatal, Paire OB Tablet Plus DHA, PNV OB + DHA, PNV Prenatal, PNV Prenatal Plus Multivitamin, PNV-DHA, PNV-DHA Plus, PNV-First, PNV-Iron, PNV-OB with DHA, PNV-Select, PNV: Ferrous Fumerate/Docusate/Folic Acid, PR Natal 400, PR [place] 400ec, PR [address] 430, PR [address] 430ec, PR [place] 440ec, PreCare, PreCare Conceive, PreferaOB, PreferaOB + DHA, Premesis Rx, Prena1 Plus, Prena1 True, Prenaissance 90 DHA, Prenaissance Balance, Prenaissance DHA, Prenaissance Harmony DHA, Prenaissance Plus, Prenaissance Promise, PrenaPlus, Prenatabs OBN, Prenatabs RX, Prenatal, Prenatal 19, Prenatal AD, Prenatal Low Iron, Prenatal Multivitamin + DHA 2, Prenatal Plus, Prenatal Plus Iron, Prenatal Plus Low Iron, Prenatal Vitamin, PreNatal Vitamins Plus, Prenate Advance, Prenate DHA, Prenate Elite, Prenate Mini, PreNate Plus, Prenate Restore with Quatrefolic, PrePLUS, Previte Rx, PruEt DHA, PruEt DHAec, PureVit DualFe Plus, RE OB + DHA, RE OB 90 + DHA, RE PreVit + DHA, RE-Nata 29, RE-Nata 29 OB, Reaphrim, Renate, Renate DHA, Renate DHA Extra, Rovin-Nv, Rovin-Nv DHA, Se-Care, Se-Care Conceive, Se-Care Gesture, Se-Natal 19, Se-Natal 90, Se-Natal ONE, Se-Plete DHA, Select-OB + DHA, SetonET, SetonET-EC DHA, Similac Prenatal, StrongStart, Stuart Prenatal + DHA, Taron A Prenatal Pack with DHA, Taron EC Calcium DHA Pack, Taron Prenatal with DHA, Taron-EC Cal, Thera NIKE, Thera-Tabs, Thrivite, TL-Care DHA, Tri Rx, Trifera OB, Trimesis Rx, TriStart DHA, Triveen-Duo DHA, Trust [employer], UpSpring Prenatal+, Vena-Bal DHA, Verotin-GR, Vinate C, Vinate Care, Vinate II, Vinate III, Virt-Nate, Virt-PN, Virt-PN DHA, vita True, Vitafol PN, Vitafol Prenatal with Iron, Vitafol Ultra, Vitafol-Nano Prenatal, Vitafol-OB, Vitafol-OB + DHA, Vitafol-OB and DHA, Vitafol-One, VitaMed MD Plus Rx, VitaNatal OB Plus DHA, VitaPhil + DHA, VitaPhil + DHA 90, Vol-Plus, Vol-Tab Rx, VP CH Ultra, VP-CH Plus, VP-CH-PNV, VP-HEME OB+ DHA, VP-PNV-DHA,  WesCap-PN, Zatean-CH, Zatean-Pn, Zatean-Pn DHA What should I tell my care team before I take this medication? They need to know if you have  any of these conditions: Bleeding or clotting disorder History of anemia of any type Other chronic health condition An unusual or allergic reaction to vitamins, minerals, other medications, foods, dyes, or preservatives How should I use this medication? Take this medication by mouth with a glass of water. You can take it with or without food. If it upsets your stomach, take it with food. Chewable prenatal vitamin tablets may be chewed completely before swallowing. Follow the directions on the prescription label. The usual dose is taken once a day. Do not take your medication more often than directed. Contact your care team about the use of this medication in children. Special care may be needed. This medication is intended for females who are pregnant, breast-feeding, or may become pregnant. Overdosage: If you think you have taken too much of this medicine contact a poison control center or emergency room at once. NOTE: This medicine is only for you. Do not share this medicine with others. What if I miss a dose? If you miss a dose, take it as soon as you can. If it is almost time for your next dose, take only that dose. Do not take double or extra doses. What may interact with this medication? Alendronate Antacids Cefdinir Cefditoren Etidronate Fluoroquinolone antibiotics, such as ciprofloxacin, gatifloxacin, levofloxacin Ibandronate Levodopa Risedronate Tetracycline antibiotics, such as doxycycline, minocycline, tetracycline Thyroid hormones Warfarin This list may not describe all possible interactions. Give your health care provider a list of all the medicines, herbs, non-prescription drugs, or dietary supplements you use. Also tell them if you smoke, drink alcohol, or use illegal drugs. Some items may interact with your medicine. What should I  watch for while using this medication? See your care team for regular checks on your progress. Remember that vitamin and mineral supplements do not replace the need for good nutrition from a balanced diet. Stools commonly change color when vitamins and minerals are taken. Notify your care team if this change is alarming or accompanied by other symptoms, like abdominal pain. What side effects may I notice from receiving this medication? Side effects that you should report to your care team as soon as possible: Allergic reactions--skin rash, itching, hives, swelling of the face, lips, tongue, or throat This list may not describe all possible side effects. Call your doctor for medical advice about side effects. You may report side effects to FDA at 1-800-FDA-1088. Where should I keep my medication? Keep out of the reach of children. Most vitamins and minerals should be stored at controlled room temperature. Check your specific product directions. Protect from heat and moisture. Throw away any unused medication after the expiration date. NOTE: This sheet is a summary. It may not cover all possible information. If you have questions about this medicine, talk to your doctor, pharmacist, or health care provider.  2024 Elsevier/Gold Standard (2023-01-17 00:00:00)

## 2023-06-07 NOTE — Progress Notes (Signed)
Subjective:    Patient ID: Shelley Whitehead, female    DOB: 11-23-1990, 32 y.o.   MRN: [id number]   Chief Complaint: ADD  HPI  Patient has been on ADD meds since high school. She is not able to calm down and complete tasks without mediation. She is currently on vyvanse 40mg . 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 Says she is doing well with no medication side effetcs. BUT she has found out she is pregnant and so she is not taking it right now. She is about 9 weeks and has OB appointment in 2 weeks. She needs  to have thyroid checked.  Patient Active Problem List   Diagnosis Date Noted   Other headache syndrome 05/06/2021   Panic attacks 02/13/2019   Acquired hypothyroidism 12/01/2018   ADD (attention deficit disorder) 07/27/2013   Depression 01/14/2011       Review of Systems  Constitutional:  Negative for diaphoresis.  Eyes:  Negative for pain.  Respiratory:  Negative for shortness of breath.   Cardiovascular:  Negative for chest pain, palpitations and leg swelling.  Gastrointestinal:  Negative for abdominal pain.  Endocrine: Negative for polydipsia.  Skin:  Negative for rash.  Neurological:  Negative for dizziness, weakness and headaches.  Hematological:  Does not bruise/bleed easily.  All other systems reviewed and are negative.      Objective:   Physical Exam Vitals and nursing note reviewed.  Constitutional:      General: She is not in acute distress.    Appearance: Normal appearance. She is well-developed.  Neck:     Vascular: No carotid bruit or JVD.  Cardiovascular:     Rate and Rhythm: Normal rate and regular rhythm.     Heart sounds: Normal heart sounds.  Pulmonary:     Effort: Pulmonary effort is normal. No respiratory distress.     Breath sounds: Normal breath sounds. No wheezing or rales.  Chest:     Chest wall: No tenderness.  Abdominal:     General: Bowel sounds are normal. There is no distension or abdominal bruit.     Palpations:  Abdomen is soft. There is no hepatomegaly, splenomegaly, mass or pulsatile mass.     Tenderness: There is no abdominal tenderness.  Musculoskeletal:        General: Normal range of motion.     Cervical back: Normal range of motion and neck supple.  Lymphadenopathy:     Cervical: No cervical adenopathy.  Skin:    General: Skin is warm and dry.  Neurological:     Mental Status: She is alert and oriented to person, place, and time.     Deep Tendon Reflexes: Reflexes are normal and symmetric.  Psychiatric:        Behavior: Behavior normal.        Thought Content: Thought content normal.        Judgment: Judgment normal.    BP 132/78   Pulse 90   Temp (!) 97.2 F (36.2 C) (Temporal)   Resp 20   Ht 5\' 2"  (1.575 m)   Wt 228 lb (103.4 kg)   SpO2 96%   BMI 41.70 kg/m         Assessment & Plan:   Anaih Boskovich in today with chief complaint of Wants thyroid levels checked   1. Attention deficit disorder (ADD) without hyperactivity Avoid vyvance while pregnant  2. Acquired hypothyroidism Labs pending - Thyroid Panel With TSH    The above assessment and  management plan was discussed with the patient. The patient verbalized understanding of and has agreed to the management plan. Patient is aware to call the clinic if symptoms persist or worsen. Patient is aware when to return to the clinic for a follow-up visit. Patient educated on when it is appropriate to go to the emergency department.   Shelley Daphine Deutscher, FNP

## 2023-06-17 LAB — OB RESULTS CONSOLE RUBELLA ANTIBODY, IGM: Rubella: IMMUNE

## 2023-06-17 LAB — OB RESULTS CONSOLE HIV ANTIBODY (ROUTINE TESTING): HIV: NONREACTIVE

## 2023-06-17 LAB — OB RESULTS CONSOLE ANTIBODY SCREEN: Antibody Screen: NEGATIVE

## 2023-06-17 LAB — OB RESULTS CONSOLE GC/CHLAMYDIA
Chlamydia: NEGATIVE
Neisseria Gonorrhea: NEGATIVE

## 2023-06-17 LAB — OB RESULTS CONSOLE RPR: RPR: NONREACTIVE

## 2023-06-17 LAB — OB RESULTS CONSOLE HEPATITIS B SURFACE ANTIGEN: Hepatitis B Surface Ag: NEGATIVE

## 2023-06-17 LAB — OB RESULTS CONSOLE VARICELLA ZOSTER ANTIBODY, IGG: Varicella: IMMUNE

## 2023-06-17 LAB — OB RESULTS CONSOLE ABO/RH: RH Type: POSITIVE

## 2023-11-17 LAB — OB RESULTS CONSOLE RPR: RPR: NONREACTIVE

## 2023-12-14 ENCOUNTER — Encounter (HOSPITAL_COMMUNITY): Payer: Self-pay | Admitting: *Deleted

## 2023-12-14 ENCOUNTER — Inpatient Hospital Stay (HOSPITAL_COMMUNITY): Payer: 59 | Admitting: Anesthesiology

## 2023-12-14 ENCOUNTER — Inpatient Hospital Stay (HOSPITAL_COMMUNITY)
Admission: AD | Admit: 2023-12-14 | Discharge: 2023-12-17 | DRG: 788 | Disposition: A | Discharge status: Home / Self Care | Payer: 59 | Attending: Obstetrics | Admitting: Obstetrics

## 2023-12-14 ENCOUNTER — Encounter (HOSPITAL_COMMUNITY)
Admission: AD | Disposition: A | Discharge status: Home / Self Care | Payer: Self-pay | Source: Home / Self Care | Attending: Obstetrics

## 2023-12-14 ENCOUNTER — Other Ambulatory Visit: Payer: Self-pay

## 2023-12-14 DIAGNOSIS — O42013 Preterm premature rupture of membranes, onset of labor within 24 hours of rupture, third trimester: Principal | ICD-10-CM | POA: Diagnosis present

## 2023-12-14 DIAGNOSIS — Z8249 Family history of ischemic heart disease and other diseases of the circulatory system: Secondary | ICD-10-CM | POA: Diagnosis not present

## 2023-12-14 DIAGNOSIS — O99214 Obesity complicating childbirth: Secondary | ICD-10-CM | POA: Diagnosis present

## 2023-12-14 DIAGNOSIS — Z833 Family history of diabetes mellitus: Secondary | ICD-10-CM

## 2023-12-14 DIAGNOSIS — O134 Gestational [pregnancy-induced] hypertension without significant proteinuria, complicating childbirth: Secondary | ICD-10-CM | POA: Diagnosis present

## 2023-12-14 DIAGNOSIS — Z3A36 36 weeks gestation of pregnancy: Principal | ICD-10-CM

## 2023-12-14 DIAGNOSIS — E66813 Obesity, class 3: Secondary | ICD-10-CM | POA: Diagnosis present

## 2023-12-14 DIAGNOSIS — Z79890 Hormone replacement therapy (postmenopausal): Secondary | ICD-10-CM | POA: Diagnosis not present

## 2023-12-14 DIAGNOSIS — O35DXX0 Maternal care for other (suspected) fetal abnormality and damage, fetal gastrointestinal anomalies, not applicable or unspecified: Secondary | ICD-10-CM | POA: Diagnosis present

## 2023-12-14 DIAGNOSIS — E039 Hypothyroidism, unspecified: Secondary | ICD-10-CM | POA: Diagnosis present

## 2023-12-14 DIAGNOSIS — O99284 Endocrine, nutritional and metabolic diseases complicating childbirth: Secondary | ICD-10-CM | POA: Diagnosis present

## 2023-12-14 DIAGNOSIS — O321XX0 Maternal care for breech presentation, not applicable or unspecified: Secondary | ICD-10-CM

## 2023-12-14 DIAGNOSIS — O42919 Preterm premature rupture of membranes, unspecified as to length of time between rupture and onset of labor, unspecified trimester: Secondary | ICD-10-CM

## 2023-12-14 DIAGNOSIS — O42913 Preterm premature rupture of membranes, unspecified as to length of time between rupture and onset of labor, third trimester: Secondary | ICD-10-CM | POA: Diagnosis present

## 2023-12-14 DIAGNOSIS — O139 Gestational [pregnancy-induced] hypertension without significant proteinuria, unspecified trimester: Secondary | ICD-10-CM | POA: Diagnosis not present

## 2023-12-14 DIAGNOSIS — O163 Unspecified maternal hypertension, third trimester: Secondary | ICD-10-CM

## 2023-12-14 LAB — WET PREP, GENITAL
Clue Cells Wet Prep HPF POC: NONE SEEN
Sperm: NONE SEEN
Trich, Wet Prep: NONE SEEN
WBC, Wet Prep HPF POC: <10 (ref <10)
Yeast Wet Prep HPF POC: NONE SEEN

## 2023-12-14 LAB — COMPREHENSIVE METABOLIC PANEL
ALT: 38 U/L (ref 0–44)
AST: 30 U/L (ref 15–41)
Albumin: 2.7 g/dL — ABNORMAL LOW (ref 3.5–5.0)
Alkaline Phosphatase: 134 U/L — ABNORMAL HIGH (ref 38–126)
Anion gap: 14 (ref 5–15)
BUN: 13 mg/dL (ref 6–20)
CO2: 17 mmol/L — ABNORMAL LOW (ref 22–32)
Calcium: 9.4 mg/dL (ref 8.9–10.3)
Chloride: 104 mmol/L (ref 98–111)
Creatinine, Ser: 0.93 mg/dL (ref 0.44–1.00)
GFR, Estimated: >60 mL/min (ref >60)
Glucose, Bld: 67 mg/dL — ABNORMAL LOW (ref 70–99)
Potassium: 3.8 mmol/L (ref 3.5–5.1)
Sodium: 135 mmol/L (ref 135–145)
Total Bilirubin: 0.5 mg/dL (ref 0.0–1.2)
Total Protein: 6.5 g/dL (ref 6.5–8.1)

## 2023-12-14 LAB — CBC
HCT: 35 % — ABNORMAL LOW (ref 36.0–46.0)
Hemoglobin: 12 g/dL (ref 12.0–15.0)
MCH: 28.3 pg (ref 26.0–34.0)
MCHC: 34.3 g/dL (ref 30.0–36.0)
MCV: 82.5 fL (ref 80.0–100.0)
Platelets: 242 10*3/uL (ref 150–400)
RBC: 4.24 MIL/uL (ref 3.87–5.11)
RDW: 14.6 % (ref 11.5–15.5)
WBC: 10.7 10*3/uL — ABNORMAL HIGH (ref 4.0–10.5)
nRBC: 0 % (ref 0.0–0.2)

## 2023-12-14 LAB — URINALYSIS, ROUTINE W REFLEX MICROSCOPIC
Bilirubin Urine: NEGATIVE
Glucose, UA: NEGATIVE mg/dL
Hgb urine dipstick: NEGATIVE
Ketones, ur: NEGATIVE mg/dL
Leukocytes,Ua: NEGATIVE
Nitrite: NEGATIVE
Protein, ur: NEGATIVE mg/dL
Specific Gravity, Urine: 1.013 (ref 1.005–1.030)
pH: 6 (ref 5.0–8.0)

## 2023-12-14 LAB — TYPE AND SCREEN
ABO/RH(D): A POS
Antibody Screen: NEGATIVE

## 2023-12-14 LAB — RUPTURE OF MEMBRANE (ROM)PLUS: Rom Plus: POSITIVE

## 2023-12-14 SURGERY — Surgical Case
Anesthesia: Spinal

## 2023-12-14 MED ORDER — AMISULPRIDE (ANTIEMETIC) 5 MG/2ML IV SOLN: 10.0000 mg | Freq: Once | INTRAVENOUS | Status: DC | PRN

## 2023-12-14 MED ORDER — SODIUM CHLORIDE 0.9 % IV SOLN: 500.0000 mg | INTRAVENOUS | Status: DC

## 2023-12-14 MED ORDER — OXYCODONE HCL 5 MG PO TABS: 5.0000 mg | ORAL_TABLET | Freq: Once | ORAL | Status: DC | PRN

## 2023-12-14 MED ORDER — COCONUT OIL OIL: 1.0000 | TOPICAL_OIL | Status: DC | PRN

## 2023-12-14 MED ORDER — SCOPOLAMINE 1 MG/3DAYS TD PT72
1.0000 | MEDICATED_PATCH | Freq: Once | TRANSDERMAL | Status: AC
  Administered 2023-12-14: 1.5 mg via TRANSDERMAL

## 2023-12-14 MED ORDER — SCOPOLAMINE 1 MG/3DAYS TD PT72
MEDICATED_PATCH | TRANSDERMAL | Status: AC
  Filled 2023-12-14: qty 1

## 2023-12-14 MED ORDER — NALOXONE HCL 4 MG/10ML IJ SOLN: 1.0000 ug/kg/h | INTRAVENOUS | Status: DC | PRN

## 2023-12-14 MED ORDER — LEVOTHYROXINE SODIUM 50 MCG PO TABS
50.0000 ug | ORAL_TABLET | Freq: Every day | ORAL | Status: DC
  Administered 2023-12-15 – 2023-12-17 (×3): 50 ug via ORAL
  Filled 2023-12-14 (×3): qty 1

## 2023-12-14 MED ORDER — MENTHOL 3 MG MT LOZG: 1.0000 | LOZENGE | OROMUCOSAL | Status: DC | PRN

## 2023-12-14 MED ORDER — OXYTOCIN-SODIUM CHLORIDE 30-0.9 UT/500ML-% IV SOLN
2.5000 [IU]/h | INTRAVENOUS | Status: AC
  Administered 2023-12-14: 2.5 [IU]/h via INTRAVENOUS

## 2023-12-14 MED ORDER — SODIUM CHLORIDE 0.9% FLUSH: 3.0000 mL | INTRAVENOUS | Status: DC | PRN

## 2023-12-14 MED ORDER — FENTANYL CITRATE (PF) 100 MCG/2ML IJ SOLN
INTRAMUSCULAR | Status: AC
  Filled 2023-12-14: qty 2

## 2023-12-14 MED ORDER — LACTATED RINGERS IV SOLN: INTRAVENOUS | Status: DC

## 2023-12-14 MED ORDER — MORPHINE SULFATE (PF) 0.5 MG/ML IJ SOLN
INTRAMUSCULAR | Status: DC | PRN
  Administered 2023-12-14: 150 ug via INTRATHECAL

## 2023-12-14 MED ORDER — SIMETHICONE 80 MG PO CHEW
80.0000 mg | CHEWABLE_TABLET | ORAL | Status: DC | PRN
  Administered 2023-12-17: 80 mg via ORAL
  Filled 2023-12-14: qty 1

## 2023-12-14 MED ORDER — ACETAMINOPHEN 10 MG/ML IV SOLN
INTRAVENOUS | Status: DC | PRN
Start: 2023-12-14 — End: 2023-12-14
  Administered 2023-12-14: 1000 mg via INTRAVENOUS

## 2023-12-14 MED ORDER — MEPERIDINE HCL 25 MG/ML IJ SOLN: 6.2500 mg | INTRAMUSCULAR | Status: DC | PRN

## 2023-12-14 MED ORDER — SODIUM CHLORIDE 0.9 % IV SOLN: 12.5000 mg | INTRAVENOUS | Status: DC | PRN

## 2023-12-14 MED ORDER — ONDANSETRON HCL 4 MG/2ML IJ SOLN
INTRAMUSCULAR | Status: AC
  Filled 2023-12-14: qty 2

## 2023-12-14 MED ORDER — IBUPROFEN 600 MG PO TABS
600.0000 mg | ORAL_TABLET | Freq: Four times a day (QID) | ORAL | Status: DC
  Administered 2023-12-16 – 2023-12-17 (×7): 600 mg via ORAL
  Filled 2023-12-14 (×7): qty 1

## 2023-12-14 MED ORDER — FENTANYL CITRATE (PF) 100 MCG/2ML IJ SOLN: 25.0000 ug | INTRAMUSCULAR | Status: DC | PRN

## 2023-12-14 MED ORDER — LACTATED RINGERS IV BOLUS
1000.0000 mL | Freq: Once | INTRAVENOUS | Status: AC
  Administered 2023-12-14: 1000 mL via INTRAVENOUS

## 2023-12-14 MED ORDER — CEFAZOLIN SODIUM-DEXTROSE 2-4 GM/100ML-% IV SOLN: 2.0000 g | INTRAVENOUS | Status: DC

## 2023-12-14 MED ORDER — DIPHENHYDRAMINE HCL 50 MG/ML IJ SOLN: 12.5000 mg | INTRAMUSCULAR | Status: DC | PRN

## 2023-12-14 MED ORDER — TRANEXAMIC ACID-NACL 1000-0.7 MG/100ML-% IV SOLN
INTRAVENOUS | Status: DC | PRN
Start: 2023-12-14 — End: 2023-12-14
  Administered 2023-12-14: 1000 mg via INTRAVENOUS

## 2023-12-14 MED ORDER — EPHEDRINE SULFATE (PRESSORS) 50 MG/ML IJ SOLN: INTRAMUSCULAR | Status: DC | PRN

## 2023-12-14 MED ORDER — KETOROLAC TROMETHAMINE 30 MG/ML IJ SOLN
INTRAMUSCULAR | Status: AC
  Filled 2023-12-14: qty 1

## 2023-12-14 MED ORDER — ONDANSETRON HCL 4 MG/2ML IJ SOLN
INTRAMUSCULAR | Status: DC | PRN
  Administered 2023-12-14: 4 mg via INTRAVENOUS

## 2023-12-14 MED ORDER — OXYCODONE HCL 5 MG/5ML PO SOLN: 5.0000 mg | Freq: Once | ORAL | Status: DC | PRN

## 2023-12-14 MED ORDER — MORPHINE SULFATE (PF) 0.5 MG/ML IJ SOLN
INTRAMUSCULAR | Status: AC
  Filled 2023-12-14: qty 10

## 2023-12-14 MED ORDER — LACTATED RINGERS IV SOLN: INTRAVENOUS | Status: DC | PRN

## 2023-12-14 MED ORDER — ACETAMINOPHEN 500 MG PO TABS
1000.0000 mg | ORAL_TABLET | Freq: Four times a day (QID) | ORAL | Status: DC
  Administered 2023-12-14 – 2023-12-17 (×11): 1000 mg via ORAL
  Filled 2023-12-14 (×11): qty 2

## 2023-12-14 MED ORDER — KETOROLAC TROMETHAMINE 30 MG/ML IJ SOLN: 30.0000 mg | Freq: Four times a day (QID) | INTRAMUSCULAR | Status: AC | PRN

## 2023-12-14 MED ORDER — NALOXONE HCL 0.4 MG/ML IJ SOLN: 0.4000 mg | INTRAMUSCULAR | Status: DC | PRN

## 2023-12-14 MED ORDER — FENTANYL CITRATE (PF) 100 MCG/2ML IJ SOLN
INTRAMUSCULAR | Status: DC | PRN
  Administered 2023-12-14: 15 ug via INTRATHECAL

## 2023-12-14 MED ORDER — OXYTOCIN-SODIUM CHLORIDE 30-0.9 UT/500ML-% IV SOLN
INTRAVENOUS | Status: AC
  Filled 2023-12-14: qty 500

## 2023-12-14 MED ORDER — EPHEDRINE 5 MG/ML INJ
INTRAVENOUS | Status: AC
  Filled 2023-12-14: qty 5

## 2023-12-14 MED ORDER — SODIUM CHLORIDE 0.9 % IV SOLN
INTRAVENOUS | Status: DC | PRN
  Administered 2023-12-14: 500 mg via INTRAVENOUS

## 2023-12-14 MED ORDER — SOD CITRATE-CITRIC ACID 500-334 MG/5ML PO SOLN
30.0000 mL | ORAL | Status: AC
  Administered 2023-12-14: 30 mL via ORAL

## 2023-12-14 MED ORDER — ONDANSETRON HCL 4 MG/2ML IJ SOLN
4.0000 mg | Freq: Three times a day (TID) | INTRAMUSCULAR | Status: DC | PRN
  Administered 2023-12-14: 4 mg via INTRAVENOUS
  Filled 2023-12-14: qty 2

## 2023-12-14 MED ORDER — ZOLPIDEM TARTRATE 5 MG PO TABS: 5.0000 mg | ORAL_TABLET | Freq: Every evening | ORAL | Status: DC | PRN

## 2023-12-14 MED ORDER — DEXAMETHASONE SODIUM PHOSPHATE 10 MG/ML IJ SOLN
INTRAMUSCULAR | Status: AC
  Filled 2023-12-14: qty 1

## 2023-12-14 MED ORDER — WITCH HAZEL-GLYCERIN EX PADS: 1.0000 | MEDICATED_PAD | CUTANEOUS | Status: DC | PRN

## 2023-12-14 MED ORDER — OXYTOCIN-SODIUM CHLORIDE 30-0.9 UT/500ML-% IV SOLN
INTRAVENOUS | Status: DC | PRN
  Administered 2023-12-14: 300 mL via INTRAVENOUS

## 2023-12-14 MED ORDER — PROMETHAZINE (PHENERGAN) 6.25MG IN NS 50ML IVPB
6.2500 mg | Freq: Once | INTRAVENOUS | Status: AC
  Administered 2023-12-14: 6.25 mg via INTRAVENOUS
  Filled 2023-12-14: qty 6.25

## 2023-12-14 MED ORDER — BUPIVACAINE IN DEXTROSE 0.75-8.25 % IT SOLN
INTRATHECAL | Status: DC | PRN
Start: 2023-12-14 — End: 2023-12-14
  Administered 2023-12-14: 1.6 mL via INTRATHECAL

## 2023-12-14 MED ORDER — KETOROLAC TROMETHAMINE 30 MG/ML IJ SOLN
30.0000 mg | Freq: Four times a day (QID) | INTRAMUSCULAR | Status: AC
  Administered 2023-12-15 (×3): 30 mg via INTRAVENOUS
  Filled 2023-12-14 (×4): qty 1

## 2023-12-14 MED ORDER — EPHEDRINE SULFATE-NACL 50-0.9 MG/10ML-% IV SOSY
PREFILLED_SYRINGE | INTRAVENOUS | Status: DC | PRN
  Administered 2023-12-14 (×2): 5 mg via INTRAVENOUS

## 2023-12-14 MED ORDER — ENOXAPARIN SODIUM 60 MG/0.6ML IJ SOSY
60.0000 mg | PREFILLED_SYRINGE | INTRAMUSCULAR | Status: DC
  Administered 2023-12-15 – 2023-12-17 (×3): 60 mg via SUBCUTANEOUS
  Filled 2023-12-14 (×3): qty 0.6

## 2023-12-14 MED ORDER — CEFAZOLIN SODIUM-DEXTROSE 2-3 GM-%(50ML) IV SOLR
INTRAVENOUS | Status: DC | PRN
Start: 2023-12-14 — End: 2023-12-14
  Administered 2023-12-14: 2 g via INTRAVENOUS

## 2023-12-14 MED ORDER — CEFAZOLIN SODIUM-DEXTROSE 2-4 GM/100ML-% IV SOLN
INTRAVENOUS | Status: AC
  Filled 2023-12-14: qty 100

## 2023-12-14 MED ORDER — DIBUCAINE (PERIANAL) 1 % EX OINT: 1.0000 | TOPICAL_OINTMENT | CUTANEOUS | Status: DC | PRN

## 2023-12-14 MED ORDER — SIMETHICONE 80 MG PO CHEW
80.0000 mg | CHEWABLE_TABLET | Freq: Three times a day (TID) | ORAL | Status: DC
  Administered 2023-12-15 – 2023-12-17 (×7): 80 mg via ORAL
  Filled 2023-12-14 (×7): qty 1

## 2023-12-14 MED ORDER — SOD CITRATE-CITRIC ACID 500-334 MG/5ML PO SOLN
ORAL | Status: AC
  Filled 2023-12-14: qty 30

## 2023-12-14 MED ORDER — STERILE WATER FOR IRRIGATION IR SOLN
Status: DC | PRN
  Administered 2023-12-14: 1

## 2023-12-14 MED ORDER — DIPHENHYDRAMINE HCL 25 MG PO CAPS: 25.0000 mg | ORAL_CAPSULE | Freq: Four times a day (QID) | ORAL | Status: DC | PRN

## 2023-12-14 MED ORDER — KETOROLAC TROMETHAMINE 30 MG/ML IJ SOLN
30.0000 mg | Freq: Four times a day (QID) | INTRAMUSCULAR | Status: AC | PRN
  Administered 2023-12-14 (×2): 30 mg via INTRAVENOUS

## 2023-12-14 MED ORDER — DIPHENHYDRAMINE HCL 25 MG PO CAPS: 25.0000 mg | ORAL_CAPSULE | ORAL | Status: DC | PRN

## 2023-12-14 MED ORDER — PHENYLEPHRINE HCL-NACL 20-0.9 MG/250ML-% IV SOLN
INTRAVENOUS | Status: DC | PRN
  Administered 2023-12-14: 60 ug/min via INTRAVENOUS

## 2023-12-14 MED ORDER — DEXAMETHASONE SODIUM PHOSPHATE 10 MG/ML IJ SOLN
INTRAMUSCULAR | Status: DC | PRN
  Administered 2023-12-14: 10 mg via INTRAVENOUS

## 2023-12-14 MED ORDER — SENNOSIDES-DOCUSATE SODIUM 8.6-50 MG PO TABS
2.0000 | ORAL_TABLET | Freq: Every day | ORAL | Status: DC
  Administered 2023-12-15 – 2023-12-17 (×3): 2 via ORAL
  Filled 2023-12-14 (×3): qty 2

## 2023-12-14 MED ORDER — OXYCODONE HCL 5 MG PO TABS: 5.0000 mg | ORAL_TABLET | ORAL | Status: DC | PRN

## 2023-12-14 SURGICAL SUPPLY — 28 items
BARRIER ADHS 3X4 INTERCEED (GAUZE/BANDAGES/DRESSINGS) IMPLANT
BENZOIN TINCTURE PRP APPL 2/3 (GAUZE/BANDAGES/DRESSINGS) IMPLANT
CHLORAPREP W/TINT 26 (MISCELLANEOUS) ×2 IMPLANT
CLAMP UMBILICAL CORD (MISCELLANEOUS) ×1 IMPLANT
CLOTH BEACON ORANGE TIMEOUT ST (SAFETY) ×1 IMPLANT
DRSG OPSITE POSTOP 4X10 (GAUZE/BANDAGES/DRESSINGS) ×1 IMPLANT
ELECT REM PT RETURN 9FT ADLT (ELECTROSURGICAL) ×1 IMPLANT
ELECTRODE REM PT RTRN 9FT ADLT (ELECTROSURGICAL) ×1 IMPLANT
EXTRACTOR VACUUM KIWI (MISCELLANEOUS) IMPLANT
GAUZE SPONGE 4X4 12PLY STRL LF (GAUZE/BANDAGES/DRESSINGS) IMPLANT
GLOVE BIO SURGEON STRL SZ7 (GLOVE) ×1 IMPLANT
GLOVE BIOGEL PI IND STRL 7.0 (GLOVE) ×1 IMPLANT
GOWN STRL REUS W/TWL LRG LVL3 (GOWN DISPOSABLE) ×2 IMPLANT
KIT ABG SYR 3ML LUER SLIP (SYRINGE) IMPLANT
MAT PREVALON FULL STRYKER (MISCELLANEOUS) IMPLANT
NDL HYPO 25X5/8 SAFETYGLIDE (NEEDLE) IMPLANT
NEEDLE HYPO 25X5/8 SAFETYGLIDE (NEEDLE) IMPLANT
NS IRRIG 1000ML POUR BTL (IV SOLUTION) ×1 IMPLANT
PACK C SECTION WH (CUSTOM PROCEDURE TRAY) ×1 IMPLANT
PAD OB MATERNITY 4.3X12.25 (PERSONAL CARE ITEMS) ×1 IMPLANT
STRIP CLOSURE SKIN 1/2X4 (GAUZE/BANDAGES/DRESSINGS) IMPLANT
SUT PLAIN ABS 2-0 CT1 27XMFL (SUTURE) ×2 IMPLANT
SUT VIC AB 0 CT1 36 (SUTURE) ×2 IMPLANT
SUT VIC AB 0 CTX36XBRD ANBCTRL (SUTURE) ×2 IMPLANT
SUT VIC AB 4-0 KS 27 (SUTURE) ×1 IMPLANT
TOWEL OR 17X24 6PK STRL BLUE (TOWEL DISPOSABLE) ×1 IMPLANT
TRAY FOLEY W/BAG SLVR 14FR LF (SET/KITS/TRAYS/PACK) IMPLANT
WATER STERILE IRR 1000ML POUR (IV SOLUTION) ×1 IMPLANT

## 2023-12-14 NOTE — MAU Provider Note (Addendum)
 ROR    S [name] is a 33 y.o. G1P0000 pregnant female at [redacted]w[redacted]d per Dr. Juliene Pina telephone sign off who presents to MAU today with complaint of ?ROM.  She states she had a gush of fluids at work 2/18 with trickle after and awoke this morning with wet underwear as well.  Dr. Juliene Pina states Nitrozine in clinic today negative but with convincing story wanted her evaluated with ROM plus given infant last known to be breech.  Pt states last ate at 0830 2/19, last fluid intake 0930 2/19.  Denies VB, CTX, DFM.     Pertinent items noted in HPI and remainder of comprehensive ROS otherwise negative.   O BP (!) 129/95   Pulse 72   Temp 98 F (36.7 C) (Oral)   Resp 18   Ht 5\' 2"  (1.575 m)   Wt 116.9 kg   LMP 01/04/2023   SpO2 99%   BMI 47.15 kg/m  Physical Exam Vitals and nursing note reviewed. Exam conducted with a chaperone present.  Constitutional:      General: She is not in acute distress.    Appearance: She is obese. She is not ill-appearing.  HENT:     Head: Normocephalic and atraumatic.     Right Ear: External ear normal.     Left Ear: External ear normal.     Nose: Nose normal. No congestion.     Mouth/Throat:     Mouth: Mucous membranes are moist.     Pharynx: Oropharynx is clear.  Eyes:     Extraocular Movements: Extraocular movements intact.     Conjunctiva/sclera: Conjunctivae normal.  Musculoskeletal:     Cervical back: Normal range of motion.  Neurological:     Mental Status: She is alert.    Pt informed that the ultrasound is considered a limited OB ultrasound and is not intended to be a complete ultrasound exam.  Patient also informed that the ultrasound is not being completed with the intent of assessing for fetal or placental anomalies or any pelvic abnormalities.  Explained that the purpose of today's ultrasound is to assess for  presentation.  Patient acknowledges the purpose of the exam and the limitations of the study.    My interpretation: breech     MDM: MAU Course:  Pt with mild to moderate Bps, asymptomatic.  No hx of cHTN.  Will get PreE labs to r/o PreE.  However, pt confirmed ruptured with positive ROM plus.  NPO and care transferred to Dr. Wendall Stade for eventual C/S.  Placed preoperative orders however did not consent pt for surgery.    NST = 150bpm, moderate variability, +accels, one late decel that was shallow right at transition of care.    Will give 1 L LR bolus given late decel on NST. Dr. Wendall Stade notified.    A/P #[redacted]weeks gestation #Breech presentation #PPROM   Admit for C/S. F/u preE labs.       Hessie Dibble, MD 12/14/2023 1:57 PM

## 2023-12-14 NOTE — Anesthesia Procedure Notes (Signed)
 Spinal  Patient location during procedure: OR Start time: 12/14/2023 3:27 PM End time: 12/14/2023 3:31 PM Reason for block: surgical anesthesia Staffing Performed: anesthesiologist  Anesthesiologist: Beryle Lathe, MD Performed by: Beryle Lathe, MD Authorized by: Beryle Lathe, MD   Preanesthetic Checklist Completed: patient identified, IV checked, risks and benefits discussed, surgical consent, monitors and equipment checked, pre-op evaluation and timeout performed Spinal Block Patient position: sitting Prep: DuraPrep Patient monitoring: heart rate, cardiac monitor, continuous pulse ox and blood pressure Approach: midline Location: L3-4 Injection technique: single-shot Needle Needle type: Pencan  Needle gauge: 24 G Additional Notes Consent was obtained prior to the procedure with all questions answered and concerns addressed. Risks including, but not limited to, bleeding, infection, nerve damage, paralysis, failed block, inadequate analgesia, allergic reaction, high spinal, itching, and headache were discussed and the patient wished to proceed. Functioning IV was confirmed and monitors were applied. Sterile prep and drape, including hand hygiene, mask, and sterile gloves were used. The patient was positioned and the spine was prepped. The skin was anesthetized with lidocaine. Free flow of clear CSF was obtained prior to injecting local anesthetic into the CSF. The spinal needle aspirated freely following injection. The needle was carefully withdrawn. The patient tolerated the procedure well.   Leslye Peer, MD

## 2023-12-14 NOTE — Anesthesia Preprocedure Evaluation (Addendum)
 Anesthesia Evaluation  Patient identified by MRN, date of birth, ID band Patient awake    Reviewed: Allergy & Precautions, NPO status , Patient's Chart, lab work & pertinent test results  History of Anesthesia Complications Negative for: history of anesthetic complications  Airway Mallampati: II   Neck ROM: Full    Dental   Pulmonary neg pulmonary ROS   Pulmonary exam normal        Cardiovascular negative cardio ROS Normal cardiovascular exam     Neuro/Psych  Headaches PSYCHIATRIC DISORDERS Anxiety Depression       GI/Hepatic negative GI ROS,,,(+)     substance abuse  marijuana use  Endo/Other  Hypothyroidism  Class 3 obesity  Renal/GU negative Renal ROS     Musculoskeletal negative musculoskeletal ROS (+)    Abdominal  (+) + obese  Peds  (+) ATTENTION DEFICIT DISORDER WITHOUT HYPERACTIVITY Hematology negative hematology ROS (+)  Plt 242k    Anesthesia Other Findings   Reproductive/Obstetrics (+) Pregnancy                             Anesthesia Physical Anesthesia Plan  ASA: 3  Anesthesia Plan: Spinal   Post-op Pain Management: Tylenol PO (pre-op)*   Induction:   PONV Risk Score and Plan: 2 and Treatment may vary due to age or medical condition, Ondansetron and Scopolamine patch - Pre-op  Airway Management Planned: Natural Airway  Additional Equipment: None  Intra-op Plan:   Post-operative Plan:   Informed Consent: I have reviewed the patients History and Physical, chart, labs and discussed the procedure including the risks, benefits and alternatives for the proposed anesthesia with the patient or authorized representative who has indicated his/her understanding and acceptance.       Plan Discussed with: CRNA and Anesthesiologist  Anesthesia Plan Comments: (Labs reviewed, platelets acceptable. Discussed risks and benefits of spinal, including spinal/epidural  hematoma, infection, failed block, and PDPH. Patient expressed understanding and wished to proceed. )       Anesthesia Quick Evaluation

## 2023-12-14 NOTE — Transfer of Care (Signed)
 Immediate Anesthesia Transfer of Care Note  Patient: Shelley Whitehead  Procedure(s) Performed: CESAREAN SECTION  Patient Location: PACU  Anesthesia Type:Spinal  Level of Consciousness: awake, alert , and oriented  Airway & Oxygen Therapy: Patient Spontanous Breathing  Post-op Assessment: Report given to RN and Post -op Vital signs reviewed and stable  Post vital signs: Reviewed and stable  Last Vitals:  Vitals Value Taken Time  BP 136/85 12/14/23 1645  Temp    Pulse 121 12/14/23 1647  Resp 17 12/14/23 1647  SpO2 96 % 12/14/23 1647  Vitals shown include unfiled device data.  Last Pain:  Vitals:   12/14/23 1218  TempSrc: Oral         Complications: No notable events documented.

## 2023-12-14 NOTE — H&P (Signed)
 OB ADMISSION/ HISTORY & PHYSICAL:  Admission Date: 12/14/2023 11:15 AM  Admit Diagnosis: Preterm premature rupture of membranes (PPROM) delivered, current hospitalization [O42.919]    Shelley Whitehead is a 33 y.o. female G1P0000 at [redacted]w[redacted]d presenting for leakage of fluid. She states she had a gush of fluids at work 2/18 with trickle after and awoke this morning with wet underwear as well.  Dr. Juliene Pina states Nitrozine in clinic today negative but with convincing story wanted her evaluated with ROM plus given infant last known to be breech.  Pt states last ate at 0830 2/19, last fluid intake 0930 2/19.  Denies VB, CTX, DFM. Denies headache, visual changes, chest pain, shortness of breath, or new/worsening anemia.   Prenatal History: G1P0000   EDC: 01/11/2024, Date entered prior to episode creation  Prenatal care at [facility] since 10w  Primary: Dr. Juliene Pina  Prenatal course complicated by: Bilateral fetal pyelectasis - plan post-natal eval Maternal obesity (BMI 47) Breech presentation  Hypothyroidism - on synthroid daily   Prenatal Labs: ABO, Rh: A (08/23 0000)  Antibody: Negative (08/23 0000) Rubella: Immune (08/23 0000)  RPR: Nonreactive (01/23 0000)  HBsAg: Negative (08/23 0000)  HIV: Non-reactive (08/23 0000)  GBS:   collected today 1 hr Glucola : 106 Genetic Screening: Panorama NIPT LR XY Ultrasound: 2/19 in office: Limtied sono due to poss ROM and possible breech, Baby has been breech since anatomy. Now Homero Fellers Breech again and AFI is low at 6.4 cm (was 11 cm on 2/7); 2/7: EFW 5 lbs, 4 oz, at 41%, AC 70%, frank breech, posterior placenta, right kidney pelviectasis     Maternal Diabetes: No Genetic Screening: Normal Maternal Ultrasounds/Referrals: Fetal Kidney Anomalies Fetal Ultrasounds or other Referrals:  None Maternal Substance Abuse:  No Significant Maternal Medications:  Meds include: Syntroid daily Significant Maternal Lab Results:  None Other Comments:   None  Medical / Surgical History : Past medical history:  Past Medical History:  Diagnosis Date   ADD (attention deficit disorder)    Anxiety    Dysmenorrhea    Thyroid disease    hypothyroid    Past surgical history:  Past Surgical History:  Procedure Laterality Date   polyp removed as child      Family History:  Family History  Problem Relation Age of Onset   Diabetes Mother    Heart disease Mother    Cervical cancer Mother    Allergic rhinitis Father    Breast cancer Paternal Grandmother    Angioedema Neg Hx    Asthma Neg Hx    Atopy Neg Hx    Urticaria Neg Hx     Social History:  reports that she has never smoked. She has never used smokeless tobacco. She reports current alcohol use of about 2.0 - 3.0 standard drinks of alcohol per week. She reports current drug use. Drug: Marijuana.  Allergies: Peanut-containing drug products and Soy allergy (obsolete)   Current Medications at time of admission:  Medications Prior to Admission  Medication Sig Dispense Refill Last Dose/Taking   levothyroxine (SYNTHROID) 50 MCG tablet Take 1 tablet (50 mcg total) by mouth daily. 90 tablet 1 12/14/2023 Morning   Prenatal Vit-Fe Fumarate-FA (PRENATAL MULTIVITAMIN) TABS tablet Take 1 tablet by mouth daily at 12 noon.   12/13/2023 Evening   betamethasone valerate ointment (VALISONE) 0.1 % Apply 1 application topically 2 (two) times daily. User for up to 1-2 weeks as needed (Patient not taking: Reported on 03/10/2023) 30 g 0  levocetirizine (XYZAL) 5 MG tablet Take 5 mg by mouth daily. (Patient not taking: Reported on 06/07/2023)      VYVANSE 40 MG capsule Take 1 capsule (40 mg total) by mouth daily before breakfast. 30 capsule 0    VYVANSE 40 MG capsule Take 1 capsule (40 mg total) by mouth daily before breakfast. 30 capsule 0    VYVANSE 40 MG capsule Take 1 capsule (40 mg total) by mouth daily before breakfast. (Patient not taking: Reported on 06/07/2023) 30 capsule 0      Review of  Systems: Negative except as above.   Physical Exam: Vital signs and nursing notes reviewed.  Patient Vitals for the past 24 hrs:  BP Temp Temp src Pulse Resp SpO2 Height Weight  12/14/23 1345 (!) 129/95 -- -- 72 -- 99 % -- --  12/14/23 1330 (!) 133/111 -- -- 75 -- 99 % -- --  12/14/23 1315 124/81 -- -- 62 -- 99 % -- --  12/14/23 1255 (!) 141/75 -- -- (!) 55 18 97 % -- --  12/14/23 1221 (!) 145/76 -- -- -- -- -- -- --  12/14/23 1218 (!) 153/73 98 F (36.7 C) Oral 60 18 100 % 5\' 2"  (1.575 m) 116.9 kg     General: AAO x 3, NAD Heart: RRR Lungs:CTAB Abdomen: Gravid, NT Extremities: no edema  FHR: 145 BPM, moderate variability, + accels, + isolated late decel TOCO: No contractions  Labs:   Recent Labs    12/14/23 1357  WBC 10.7*  HGB 12.0  HCT 35.0*  PLT 242   Lab Results  Component Value Date   ALT 38 12/14/2023   AST 30 12/14/2023   ALKPHOS 134 (H) 12/14/2023   BILITOT 0.5 12/14/2023   Lab Results  Component Value Date   CREATININE 0.93 12/14/2023     Assessment/Plan: 33 y.o. G1P0000 at [redacted]w[redacted]d w/breech presentation p/w PPROM confirmed by ROM plus. Plan for delivery via PCS for PPROM w/malpresentation. Vitals notable for mildly elevated BP's, preeclampsia labs within normal limits. UPC in process. Elevated BP's <4h apart, not meeting criteria for gHTN at this time. Risks and benefits of Cesarean delivery reviewed with patient and informed consent obtained. Plan to proceed to OR once labs result.   Fetal wellbeing - FHT mostly category 1, had isolated late decel which improved with IV fluids   GBS unknown (in process) Rubella imm Rh Positive  Anticipated MOD: Cesarean   Edger House, MD 12/14/2023, 2:33 PM

## 2023-12-14 NOTE — Op Note (Signed)
 Cesarean Section Operative Note  Indications: malpresentation: breech with PPROM  Pre-operative Diagnosis: [redacted]w[redacted]d   Post-operative Diagnosis: same  Surgeon: Edger House, MD  Assistants: Shea Evans, MD  Anesthesia: Epidural anesthesia  Findings: Female infant delivered in frank breech presentation at 1545 weighing 2650g with APGARS 8/9. Cord evulsion occurred soon after delivery - placenta and cord sent to pathology for evaluation. Cord immediately clamped and cut with minimal blood loss through cord. Normal appearing uterus, bilateral ovaries and fallopian tubes. Hysterotomy closed in single layer. Interceed placed over incision for excellent hemostasis. Hemostatic at end of case.   Procedure Details   Patient admitted for PPROM at 36w, plan for Cesarean delivery for breech presentation. The patient was seen in the pre-operative area. Risks, benefits, and alternatives of the procedure were reviewed and informed consent was signed and witnessed. Patient proceeded to operating room where spinal anesthesia was obtained without complication. SCD boots were placed and turned on. Fetal heart rate was checked routinely after spinal anesthesia and found to be bradycardic to 70's. Decision made to emergently proceed with Cesarean delivery. Foley catheter was placed. IV Cefazolin 2g were administered. The patient was prepped with betadine and draped in usual sterile manner. TXA  was administered at start of case.  Alis clamps were used to ensure adequate anesthesia. A Pfannenstiel incision was made with the scalpel and carried down through the subcutaneous tissue to the fascia using the scalpel. Fascial incision was made and extended transversely using the mayo scissors. The peritoneum was identified and entered bluntly. Peritoneal incision was extended longitudinally. No intraperitoneal adhesions were encountered. A low transverse uterine incision was made. A female fetus was delivered in breech at  1545 weighing 2650g with Apgars 8/9 without complication. The cord was immediately clamped and cut due to fetal distress and cord evulsion and newborn handed to awaiting pediatrics team.   An Alexis retractor was placed for excellent visualization. The uterus was exteriorized. The hysterotomy was closed using 0-vicryl in a single layer.  Normal appearing uterus, fallopian tubes, and bilateral ovaries visualized. The uterus was replaced into the abdomen and hemostasis assured. Uterine tone was noted to be firm..  The Jon Gills was removed and gutters irrigated. The fascia was closed in a single running layer of 0-vicryl. The subcutaneous layer was irrigated and re-approximated with 2-0 plain. The skin was closed with 4-0 vicryl. A dressing of steri strips, honeycomb dressing was placed.   Instrument, sponge, and needle counts were correct prior the abdominal closure and at the conclusion of the case.   Estimated Blood Loss:  245 mL         Drains: Foley catheter          Total IV Fluids: Per anesthesia record         Specimens: Placenta          Implants: Interceed         Complications:  None; patient tolerated the procedure well.         Disposition: PACU - hemodynamically stable.         Condition: stable  Edger House, MD

## 2023-12-14 NOTE — Lactation Note (Signed)
 This note was copied from a baby's chart. Lactation Consultation Note  Patient Name: Shelley Whitehead Date: 12/14/2023 Age:33 hours Reason for consult: Initial assessment;Mother's request;Primapara;1st time breastfeeding;Late-preterm 34-36.6wks;Infant < 6lbs;Maternal endocrine disorder;Breastfeeding assistance  P1- Infant was born at [redacted]w[redacted]d weighing 2650g. MOB would like to breastfeed only. Due to LPI/low birth weight feeding guidelines, infant is being supplemented with Similac Neosure formula until she can receive DBM tomorrow morning. LC had MOB sign consent forms for the [facility]. MOB was very engaged throughout the entire 1.5 hr consult despite having multiple episodes of emesis. When unwrapping infant to latch onto MOB, LC noted a small cut on infant left side near his diaper line. LC notified RN, who then notified the MD.   Feeding plan for infant: -Entire feeding may not exceed 30 minutes total (15 minutes max on breast and 15 minutes max on bottle). This may change depending on how well infant is doing.  -Pump after every breastfeeding and feed back EBM. -Use the white Nfant nipple until SLP states otherwise. -Supplementation for day 1 is a minimum of 12-14 mL, day 2 is 18-21 mL and day 3 is 24-28 mL.  First LC assisted with placing infant on the right breast in the football hold. Infant latched beautifully wide a deep latch and nursed for 30 seconds before falling asleep. Stimulation did not help infant continue. LC set up the DEBP with size 18 mm flanges. MOB pumped for 15 minutes and collected 3 mL. LC praised MOB. LC bottle fed infant this EBM. Infant tolerated feeding well. RN notified LC that infant's glucose was 39, so he needed formula as well. LC bottle fed infant 15 mL with the white Nfant nipple in a side lying pace feed. LC noted that infant was excited and would forget to take a breath, so LC had to pull the bottle away after every 3 sucks to allow infant to catch his breath.  Infant burped multiple times and seemed to tolerate the feeding well until [facility] laid him down and he had a medium thick emesis.  LC reviewed feeding infant on cue 8-12x in 24 hrs, not allowing infant to go over 3 hrs without a feeding, CDC milk storage guidelines and LC services handout. LC encouraged MOB to call for further assistance as needed.  Maternal Data Has patient been taught Hand Expression?: No Does the patient have breastfeeding experience prior to this delivery?: No  Feeding Mother's Current Feeding Choice: Breast Milk Nipple Type: Nfant Standard Flow (white)  LATCH Score Latch: Repeated attempts needed to sustain latch, nipple held in mouth throughout feeding, stimulation needed to elicit sucking reflex.  Audible Swallowing: None  Type of Nipple: Everted at rest and after stimulation  Comfort (Breast/Nipple): Soft / non-tender  Hold (Positioning): Full assist, staff holds infant at breast  LATCH Score: 5   Lactation Tools Discussed/Used Tools: Pump;Flanges Flange Size: 18 Breast pump type: Double-Electric Breast Pump;Manual Pump Education: Setup, frequency, and cleaning;Milk Storage Reason for Pumping: LPI/low birth weight feeding plan Pumping frequency: 15-20 min every 3 hrs Pumped volume: 3 mL  Interventions Interventions: Breast feeding basics reviewed;Assisted with latch;Breast compression;Adjust position;Support pillows;Position options;Expressed milk;Hand pump;DEBP;Education;Pace feeding;LC Services brochure;LPT handout/interventions  Discharge Discharge Education: Warning signs for feeding baby Pump: DEBP;Hands Free;Personal  Consult Status Consult Status: Follow-up Date: 12/15/23 Follow-up type: In-patient    Dema Severin BS, IBCLC 12/14/2023, 7:51 PM

## 2023-12-14 NOTE — MAU Note (Signed)
 Shelley Whitehead is a 33 y.o. at [redacted]w[redacted]d here in MAU reporting: thinks she lost her mucous plug Sunday morning, was having some low back pains that night.  Yesterday around 1400, felt some wetness, thought she had maybe peed her pants.  Woke up during the night  and her pants were soaked.  Has not been having contractions. Feels like a trickle, not wearing a pad. Neg test in office, sent in for further eval.  AFI noted lower today on [place]. Baby is still breech.  Scheduled for c/s. No bleeding. No recent intercourse.   Onset of complaint: yesterday 1400 Pain score: no pain Vitals:   12/14/23 1218 12/14/23 1221  BP: (!) 153/73 (!) 145/76  Pulse: 60   Resp: 18   Temp: 98 F (36.7 C)   SpO2: 100%      FHT:138 Lab orders placed from triage:  Rom + per Dr Judd Lien

## 2023-12-15 ENCOUNTER — Encounter (HOSPITAL_COMMUNITY): Payer: Self-pay | Admitting: Obstetrics

## 2023-12-15 LAB — CBC
HCT: 29.1 % — ABNORMAL LOW (ref 36.0–46.0)
Hemoglobin: 9.9 g/dL — ABNORMAL LOW (ref 12.0–15.0)
MCH: 28.4 pg (ref 26.0–34.0)
MCHC: 34 g/dL (ref 30.0–36.0)
MCV: 83.4 fL (ref 80.0–100.0)
Platelets: 167 10*3/uL (ref 150–400)
RBC: 3.49 MIL/uL — ABNORMAL LOW (ref 3.87–5.11)
RDW: 14.8 % (ref 11.5–15.5)
WBC: 12.8 10*3/uL — ABNORMAL HIGH (ref 4.0–10.5)
nRBC: 0 % (ref 0.0–0.2)

## 2023-12-15 LAB — GC/CHLAMYDIA PROBE AMP (~~LOC~~) NOT AT ARMC
Chlamydia: NEGATIVE
Comment: NEGATIVE
Comment: NORMAL
Neisseria Gonorrhea: NEGATIVE

## 2023-12-15 LAB — RPR: RPR Ser Ql: NONREACTIVE

## 2023-12-15 MED ORDER — LACTATED RINGERS IV BOLUS
500.0000 mL | Freq: Once | INTRAVENOUS | Status: AC
  Administered 2023-12-15: 500 mL via INTRAVENOUS

## 2023-12-15 NOTE — Lactation Note (Signed)
 This note was copied from a baby's chart. Lactation Consultation Note  Patient Name: Shelley Whitehead Date: 12/15/2023 Age:33 hours Reason for consult: Follow-up assessment;Primapara;1st time breastfeeding;Early term 37-38.6wks;Infant < 6lbs;Maternal endocrine disorder;Breastfeeding assistance  P1- MOB reports that she hadn't placed infant to the breast since LC last saw them yesterday. MOB also reports that she has been pumping with almost every feeding, but has skipped a few. On average MOB has been collecting 2-3 mL, but was able to collect 5 mL once. LC praised MOB for her hard work and determination. Mob also reports that infant has thrown up frequently since LC noted the first emesis yesterday. LC reassured MOB that this is often common with c-section babies due to the extra fluid in the belly. LC reviewed the side lying pace feeding position, burping techniques and holding infant upright for at least 10 minutes after every feeding to ease digestion.  Infant was cueing in the visitor's arms, so LC encouraged attempting a latch. MOB in agreement. Infant was placed on the right breast in the football hold. Infant latched and started nursing immediately. Infant nursed on and off for 7 minutes. This latch was much better than the latch LC observed last night. After 15 minutes of attempting the feeding, MOB pumped for 15 minutes. MOB was able to collect 2 mL of EBM. Infant was then pace fed the 2 mL EBM and 15 mL of DBM. Infant tolerated feeding well and did not need as much pacing today than yesterday. Infant had a small emesis of mostly mucus. Infant was then held upright in FOB's arms for 10 minutes. LC encouraged MOB to call for further assistance as needed.  Maternal Data Has patient been taught Hand Expression?: Yes Does the patient have breastfeeding experience prior to this delivery?: No  Feeding Mother's Current Feeding Choice: Breast Milk Nipple Type: Nfant Standard Flow  (white)  LATCH Score Latch: Repeated attempts needed to sustain latch, nipple held in mouth throughout feeding, stimulation needed to elicit sucking reflex.  Audible Swallowing: A few with stimulation  Type of Nipple: Everted at rest and after stimulation  Comfort (Breast/Nipple): Soft / non-tender  Hold (Positioning): Full assist, staff holds infant at breast  LATCH Score: 6   Lactation Tools Discussed/Used Tools: Pump;Flanges Flange Size: 18 Breast pump type: Double-Electric Breast Pump;Manual Pump Education: Setup, frequency, and cleaning;Milk Storage Reason for Pumping: LPI/Low birth weight Pumping frequency: 15-20 min every 3 hrs Pumped volume: 2 mL  Interventions Interventions: Breast feeding basics reviewed;Assisted with latch;Breast compression;Adjust position;Support pillows;Expressed milk;Position options;Hand pump;DEBP;Education;Pace feeding;LC Services brochure  Discharge Discharge Education: Warning signs for feeding baby Pump: DEBP;Personal  Consult Status Consult Status: Follow-up Date: 12/16/23 Follow-up type: In-patient    Dema Severin BS, IBCLC 12/15/2023, 4:32 PM

## 2023-12-15 NOTE — Anesthesia Postprocedure Evaluation (Signed)
 Anesthesia Post Note  Patient: Shelley Whitehead  Procedure(s) Performed: CESAREAN SECTION     Patient location during evaluation: PACU Anesthesia Type: Spinal Level of consciousness: awake and alert Pain management: pain level controlled Vital Signs Assessment: post-procedure vital signs reviewed and stable Respiratory status: spontaneous breathing and respiratory function stable Cardiovascular status: blood pressure returned to baseline and stable Postop Assessment: spinal receding and no apparent nausea or vomiting Anesthetic complications: no   No notable events documented.  Last Vitals:  Vitals:   12/15/23 0504 12/15/23 0730  BP: 120/64   Pulse: (!) 53 (!) 55  Resp: 18 18  Temp: 37.3 C 37.1 C  SpO2: 96% 97%    Last Pain:  Vitals:   12/15/23 0730  TempSrc:   PainSc: 0-No pain                 Beryle Lathe

## 2023-12-15 NOTE — Progress Notes (Signed)
 Postpartum Progress Note  POD#1 s/p PCS for breech w/PPROM  S: Patient seen and examined at bedside. Reports feeling overall well. Pain well controlled, ambulating, tolerating regular diet, voiding without issue. Not yet passing flatus or having bowel movement, although feels bloated and gasy. Denies fever, chills, chest pain, shortness of breath.  Feeding: plans to breastfeed Circ: Desires circumcision  O:  Vitals:   12/14/23 2132 12/15/23 0057  BP: 136/77   Pulse: (!) 58   Resp: 18 18  Temp: 98.4 F (36.9 C) 99.3 F (37.4 C)  SpO2: 97% 95%    PE:  GA: well appearing, NAD CV: RRR, normal S1, S2 Lungs: CTAB Abd: soft, appropriately tender, fundus firm below umbilicus Inc: dressing in place, clean/dry/intact Peri: moderate lochia Ext: no TTP, +1 non-pitting edema  Labs:  Lab Results  Component Value Date   WBC 10.7 (H) 12/14/2023   HGB 12.0 12/14/2023   HCT 35.0 (L) 12/14/2023   MCV 82.5 12/14/2023   PLT 242 12/14/2023   Lab Results  Component Value Date   CREATININE 0.93 12/14/2023    A/P:  33 y.o.yo G1P0101 POD#1 s/p PCS for breech w/PPROM (EBL ) with elevated BP (not meeting criteria for gHTN), doing well and progressing appropriately. Vitals within normal limits. Physical exam benign. Labs normal. Plan as follows:   #Routine OB - Regular diet, HLIV - ERAS for pain control  - Rh positive - DVT ppx: ambulating, SCDs, and Lovenox daily - BCM: Unsure  #Neonate - Desires circumcision -plans to breastfeed   #PIH - had elevated BP's on arrival to MAU (including isolated SR 133/111) <4h apart  - CMP and CBC within normal limits - Continue to monitor - if additional elevated BP will meet criteria for gHTN   Anticipate discharge home tomorrow or next day.   Marlene Bast, MD

## 2023-12-15 NOTE — Progress Notes (Addendum)
 MOB was referred for history of anxiety.  * Referral screened out by Clinical Social Worker because none of the following criteria appear to apply:  ~ History of anxiety  during this pregnancy, or of post-partum depression following prior delivery.  ~ Diagnosis of anxiety within last 3 years  Per OB notes, MOB did not indicate any signs/symptoms during her pregnancy. Anxiety 2016  OR  * MOB's symptoms currently being treated with medication and/or therapy.  Please contact the Clinical Social Worker if needs arise, by [facility] request, or if MOB scores greater than 9/yes to question 10 on Edinburgh Postpartum Depression Screen.  Enos Fling, Theresia Majors Clinical Social Worker [phone]

## 2023-12-16 LAB — SURGICAL PATHOLOGY

## 2023-12-16 MED ORDER — IBUPROFEN 200 MG PO TABS
600.0000 mg | ORAL_TABLET | Freq: Four times a day (QID) | ORAL | Status: AC
Start: 2023-12-16 — End: 2023-12-23

## 2023-12-16 MED ORDER — POLYETHYLENE GLYCOL 3350 17 G PO PACK: 17.0000 g | PACK | Freq: Every day | ORAL | Status: AC

## 2023-12-16 MED ORDER — ACETAMINOPHEN 500 MG PO TABS
1000.0000 mg | ORAL_TABLET | Freq: Four times a day (QID) | ORAL | Status: AC
Start: 2023-12-16 — End: 2023-12-23

## 2023-12-16 NOTE — Lactation Note (Signed)
 This note was copied from a baby's chart. Lactation Consultation Note  Patient Name: Boy Mychaela Lennartz [id number] Date: 12/16/2023 Age:33 years  Reason for consult: Primapara;1st time breastfeeding;Late-preterm 34-36.6wks;Follow-up assessment  P1, LPI, [redacted]w[redacted]d, 2% weight loss, 2480 g  Follow up LC visit. Mother states she has been trying to pump when baby is being fed a bottle. She hasn't been able to pump every 3 hours but plans to increase her pumping sessions. Mother is expressing 2-3 ml per breast.  Mother reports she has been adding her breast milk to baby's formula feedings. Advised for mother to give her expressed breast milk by bottle first, followed by formula. Parents agreeable and verbalize understanding.  Encouraged mother to attempt breastfeeding first and/or allow the baby to be skin to skin so he will be familiar with mother's breast. Instructed to call for assistance from [facility] or nursing, as needed.      Feeding Mother's Current Feeding Choice: Breast Milk and Formula Nipple Type: Nfant Standard Flow (white)     Lactation Tools Discussed/Used Tools: Pump Reason for Pumping: LPI/ low birth weight Pumping frequency: every 3 hours for 15 min Pumped volume: 5 mL  Consult Status Consult Status: Follow-up Date: 12/17/23 Follow-up type: In-patient    Christella Hartigan M 12/16/2023, 3:13 PM

## 2023-12-16 NOTE — Discharge Instructions (Addendum)
 Congratulations! We hope you have a wonderful postpartum period. We will plan to see you in the office in 6 weeks. Please call the office if you experience fevers (>100.4 F), heavy vaginal bleeding (using >2 pads/hour), severe pain not responsive to ibuprofen (Advil or Motrin) and acetaminophen (Tylenol), chest pain, shortness of breath, or if you have pain, redness, or swelling in one or both legs. Mood swings, fatigue, and feeling down can be normal in the first two weeks following birth. If you feel your mood symptoms are severe or lasting longer than two weeks, please call our office. If you have any thoughts of hurting yourself or others please call our office. Please call your pediatrician if you have any concerns about your baby.   You should remove your wound dressing 5 days after delivery. There will be small pieces of tape, called steri strips, underneath the dressing. You can remove these if they begin to peel off on their own, otherwise we will remove them at your 2 week follow-up visit. After removing the dressing please keep the area dry and open to the air. Please call the office if you notice new redness, pain, swelling, or drainage around the incision.  Please take your blood pressure twice a day, morning and evening, and record your results. Please call the office if your blood pressure is >160 on the top number or >110 on the bottom number. Please also call the office if you experience headaches not responsive to medication, flashing lights in your vision, chest pain, shortness of breath, right upper quadrant pain, or new/worsening/severe lower leg swelling.

## 2023-12-16 NOTE — Progress Notes (Addendum)
 Postpartum Progress Note  POD#2  s/p PCS for breech w/PPROM x24 hours 36 wk delivery   S: feels well, ready to go home. Lochia reduced. Pain well controlled. Flatus +, PO intake normal, voiding well   Feeding: plans to breastfeed Circ:done yesterday   O: BP (!) 114/58 (BP Location: Right Arm)   Pulse 63   Temp 98.3 F (36.8 C) (Oral)   Resp 18   Ht 5\' 2"  (1.575 m)   Wt 116.9 kg   LMP 01/04/2023   SpO2 98%   Breastfeeding Unknown   BMI 47.15 kg/m    PE:  GA: well appearing, NAD CV: RRR, normal S1, S2 Lungs: CTAB Abd: soft, appropriately tender, fundus firm below umbilicus Inc: dressing in place, clean/dry/intact Peri: moderate lochia Ext: no TTP, +1 non-pitting edema  Labs:     Latest Ref Rng & Units 12/15/2023    6:21 AM 12/14/2023    1:57 PM 02/17/2022    5:38 PM  CBC  WBC 4.0 - 10.5 K/uL 12.8  10.7  6.1   Hemoglobin 12.0 - 15.0 g/dL 9.9  16.1  09.6   Hematocrit 36.0 - 46.0 % 29.1  35.0  40.9   Platelets 150 - 400 K/uL 167  242  273      A/P:  33 y.o.yo G1P0101 POD#2  s/p PCS for breech w/PPROM  PPROM, no e/o infection  BPs normal, no concerns  Post op well PP well, no depression/ anxiety, good support, husband will be home for 4 wks  Breast/bottle, preterm Boy, doing well   Peds and parents decided to keep baby in house one more night due to preterm and PPROM 24 hrs before birth Anticipate D/c tomorrow   --Shea Evans MD

## 2023-12-17 DIAGNOSIS — O139 Gestational [pregnancy-induced] hypertension without significant proteinuria, unspecified trimester: Secondary | ICD-10-CM | POA: Diagnosis not present

## 2023-12-17 LAB — COMPREHENSIVE METABOLIC PANEL
ALT: 29 U/L (ref 0–44)
AST: 28 U/L (ref 15–41)
Albumin: 2.3 g/dL — ABNORMAL LOW (ref 3.5–5.0)
Alkaline Phosphatase: 74 U/L (ref 38–126)
Anion gap: 10 (ref 5–15)
BUN: 15 mg/dL (ref 6–20)
CO2: 22 mmol/L (ref 22–32)
Calcium: 8.5 mg/dL — ABNORMAL LOW (ref 8.9–10.3)
Chloride: 107 mmol/L (ref 98–111)
Creatinine, Ser: 0.73 mg/dL (ref 0.44–1.00)
GFR, Estimated: >60 mL/min (ref >60)
Glucose, Bld: 90 mg/dL (ref 70–99)
Potassium: 4.6 mmol/L (ref 3.5–5.1)
Sodium: 139 mmol/L (ref 135–145)
Total Bilirubin: 0.4 mg/dL (ref 0.0–1.2)
Total Protein: 5.4 g/dL — ABNORMAL LOW (ref 6.5–8.1)

## 2023-12-17 LAB — CBC
HCT: 29.9 % — ABNORMAL LOW (ref 36.0–46.0)
Hemoglobin: 9.7 g/dL — ABNORMAL LOW (ref 12.0–15.0)
MCH: 28.2 pg (ref 26.0–34.0)
MCHC: 32.4 g/dL (ref 30.0–36.0)
MCV: 86.9 fL (ref 80.0–100.0)
Platelets: 178 10*3/uL (ref 150–400)
RBC: 3.44 MIL/uL — ABNORMAL LOW (ref 3.87–5.11)
RDW: 15.4 % (ref 11.5–15.5)
WBC: 9.3 10*3/uL (ref 4.0–10.5)
nRBC: 0 % (ref 0.0–0.2)

## 2023-12-17 MED ORDER — FUROSEMIDE 20 MG PO TABS
40.0000 mg | ORAL_TABLET | Freq: Once | ORAL | Status: AC
  Administered 2023-12-17: 40 mg via ORAL
  Filled 2023-12-17: qty 2

## 2023-12-17 MED ORDER — NIFEDIPINE ER OSMOTIC RELEASE 30 MG PO TB24
30.0000 mg | ORAL_TABLET | Freq: Every day | ORAL | Status: DC
  Administered 2023-12-17: 30 mg via ORAL
  Filled 2023-12-17: qty 1

## 2023-12-17 MED ORDER — FUROSEMIDE 20 MG PO TABS: 40.0000 mg | ORAL_TABLET | Freq: Every day | ORAL | 0 refills | Status: AC

## 2023-12-17 MED ORDER — NIFEDIPINE ER 30 MG PO TB24: 30.0000 mg | ORAL_TABLET | Freq: Every day | ORAL | 1 refills | Status: AC

## 2023-12-17 NOTE — Discharge Summary (Signed)
 Postpartum Discharge Summary  Patient Name: Shelley Whitehead DOB: 1991-08-21 MRN: [id number]  Date of admission: 12/14/2023 Delivery date:12/14/2023 Delivering provider: D'IORIO, Byrd Hesselbach A Date of discharge: 12/17/2023  Admitting diagnosis: Preterm premature rupture of membranes (PPROM) delivered, current hospitalization [O42.919] Intrauterine pregnancy: [redacted]w[redacted]d     Secondary diagnosis:  Principal Problem:   Preterm premature rupture of membranes (PPROM) delivered, current hospitalization Active Problems:   Delivery of pregnancy by cesarean section   Breech presentation delivered   Gestational hypertension  Additional problems: None    Discharge diagnosis: Preterm Pregnancy Delivered and Gestational Hypertension                                              Post partum procedures: None Augmentation: N/A Complications: None  Hospital course: Sceduled C/S   33 y.o. yo G1P0101 at [redacted]w[redacted]d was admitted to the hospital 12/14/2023 for scheduled cesarean section with the following indication:Malpresentation with PPROM.Delivery details are as follows:  Membrane Rupture Time/Date: 3:45 PM,12/14/2023  Delivery Method:C-Section, Low Transverse Operative Delivery:N/A Details of operation can be found in separate operative note.  Patient had a postpartum course complicated by gestational hypertension with normal labs. Lower extremity edema treated with oral lasix. Started on Nifedipine 30mg  daily for mild hypertension. Plan for close outpatient follow-up.  She is ambulating, tolerating a regular diet, passing flatus, and urinating well. Patient is discharged home in stable condition on  12/17/23        Newborn Data: Birth date:12/14/2023 Birth time:3:45 PM Gender:Female Living status:Living Apgars:8 ,9  Weight:2650 g    Magnesium Sulfate received: No BMZ received: No Rhophylac:N/A MMR: N/A T-DaP:Given prenatally Flu: No RSV Vaccine received: No Transfusion:No Immunizations  administered: Immunization History  Administered Date(s) Administered   Tdap 11/23/2018    Physical exam  Vitals:   12/16/23 1515 12/16/23 2100 12/16/23 2300 12/17/23 0520  BP: 132/74 (!) 146/72 134/75 (!) 141/96  Pulse: 61 (!) 57 61 (!) 59  Resp: 20 18  18   Temp: 98.9 F (37.2 C) 98.5 F (36.9 C)  98.2 F (36.8 C)  TempSrc: Oral Oral  Oral  SpO2: 100% 100%  99%  Weight:      Height:       General: alert, cooperative, and no distress Lochia: appropriate Uterine Fundus: firm Incision: Healing well with no significant drainage DVT Evaluation: No evidence of DVT seen on physical exam. Labs: Lab Results  Component Value Date   WBC 9.3 12/17/2023   HGB 9.7 (L) 12/17/2023   HCT 29.9 (L) 12/17/2023   MCV 86.9 12/17/2023   PLT 178 12/17/2023      Latest Ref Rng & Units 12/17/2023    5:47 AM  CMP  Glucose 70 - 99 mg/dL 90   BUN 6 - 20 mg/dL 15   Creatinine 4.09 - 1.00 mg/dL 8.11   Sodium 914 - 782 mmol/L 139   Potassium 3.5 - 5.1 mmol/L 4.6   Chloride 98 - 111 mmol/L 107   CO2 22 - 32 mmol/L 22   Calcium 8.9 - 10.3 mg/dL 8.5   Total Protein 6.5 - 8.1 g/dL 5.4   Total Bilirubin 0.0 - 1.2 mg/dL 0.4   Alkaline Phos 38 - 126 U/L 74   AST 15 - 41 U/L 28   ALT 0 - 44 U/L 29    Edinburgh Score:    12/16/2023   12:00 PM  Edinburgh Postnatal Depression Scale Screening Tool  I have been able to laugh and see the funny side of things. 0  I have looked forward with enjoyment to things. 0  I have blamed myself unnecessarily when things went wrong. 1  I have been anxious or worried for no good reason. 2  I have felt scared or panicky for no good reason. 0  Things have been getting on top of me. 1  I have been so unhappy that I have had difficulty sleeping. 0  I have felt sad or miserable. 0  I have been so unhappy that I have been crying. 0  The thought of harming myself has occurred to me. 0  Edinburgh Postnatal Depression Scale Total 4      After visit meds:   Allergies as of 12/17/2023       Reactions   Peanut-containing Drug Products Swelling   Soy Allergy (obsolete) Hives        Medication List     STOP taking these medications    betamethasone valerate ointment 0.1 % Commonly known as: VALISONE   levocetirizine 5 MG tablet Commonly known as: XYZAL   Vyvanse 40 MG capsule Generic drug: lisdexamfetamine       TAKE these medications    acetaminophen 500 MG tablet Commonly known as: TYLENOL Take 2 tablets (1,000 mg total) by mouth every 6 (six) hours for 7 days.   furosemide 20 MG tablet Commonly known as: Lasix Take 2 tablets (40 mg total) by mouth daily for 5 days.   ibuprofen 200 MG tablet Commonly known as: Advil Take 3 tablets (600 mg total) by mouth every 6 (six) hours for 7 days.   levothyroxine 50 MCG tablet Commonly known as: SYNTHROID Take 1 tablet (50 mcg total) by mouth daily.   polyethylene glycol 17 g packet Commonly known as: MiraLax Take 17 g by mouth daily.   prenatal multivitamin Tabs tablet Take 1 tablet by mouth daily at 12 noon.         Discharge home in stable condition Infant Feeding: Bottle and Breast Infant Disposition:home with mother Discharge instruction: per After Visit Summary and Postpartum booklet. Activity: Advance as tolerated. Pelvic rest for 6 weeks.  Diet: routine diet Anticipated Birth Control: Unsure Postpartum Appointment:6 weeks Additional Postpartum F/U: BP check 1 week Future Appointments:No future appointments. Follow up Visit:  Follow-up Information     Ob[facility], [place]. Schedule an appointment as soon as possible for a visit in 6 week(s).   Contact information: [address] [place] [address] [phone]                     12/17/2023 Edger House, MD

## 2023-12-17 NOTE — Progress Notes (Signed)
 Dr. Wendall Stade notified of elevated BP and increasing lower extremity edema. Pt otherwise asymptomatic. MD ordered CBC and CMP. No BP parameters given.    Elvia Collum, RN 12/17/23

## 2023-12-17 NOTE — Lactation Note (Signed)
 This note was copied from a baby's chart. Lactation Consultation Note  Patient Name: Shelley Whitehead [id number] Date: 12/17/2023 Age:33 hours  Reason for consult: Follow-up assessment;Primapara;1st time breastfeeding;Infant < 6lbs;Late-preterm 34-36.6wks  P1, LPI, 6.42% weight loss   Mother's breast are full and firm. She pumped 14 ml after her shower. Mother reports she is latching baby prior to formula feeding and she feels he is making progress. Baby is now sleeping skin to skin on mother's chest. Mother has a breast pump at home and will continue to latch, pump and supplement. Mother would like to have support with breastfeeding after discharge. Mother  requested a referral to be sent to [facility], [place], for a Lactation Consultation OP visit.    Discussed the process of milk production, "supply and demand" and the importance of breast stimulation and milk removal in order to make an optimal milk supply. Discussed mother to breastfeed 8-12 times in 24 hours, skin to skin and breast feed before formula feeding.   If missed feedings at breast or substituting feeding with formula, advised to hand express and/or pump to remove milk from the breast.  Pump 8 times/24 hours to establish and maintain milk production. Mother will be able to wean off pumping as baby is gaining weight with breastfeeding.  Discussed management of engorgement. Mother has an electric pump at home.    Feeding Mother's Current Feeding Choice: Breast Milk and Formula Nipple Type: Nfant Standard Flow (white)   Interventions Interventions: Breast feeding basics reviewed;Education;LC Services brochure  Discharge Discharge Education: Engorgement and breast care;Warning signs for feeding baby;Outpatient recommendation;Outpatient Epic message sent  Consult Status Consult Status: Complete Date: 12/17/23    [name] 12/17/2023, 8:51 AM

## 2023-12-17 NOTE — Progress Notes (Signed)
 Postpartum Progress Note  POD#3 s/p PCS for  breech w/PPROM x24 hours 36 wk delivery   S: Patient seen and examined at bedside. Reports feeling overall well. Pain well controlled, ambulating, tolerating regular diet, voiding without issue. Passing flatus and having regular bowel movement. Patient with gas pains improved with simethicone. Notes new and worsening bilateral lower extremity edema. Denies fever, chills, chest pain, shortness of breath, headache, visual changes, RUQ pain.   Feeding: plans to breastfeed Circ: S/p circumcision  O:  Vitals:   12/16/23 2300 12/17/23 0520  BP: 134/75 (!) 141/96  Pulse: 61 (!) 59  Resp:  18  Temp:  98.2 F (36.8 C)  SpO2:  99%    PE:  GA: well appearing, NAD CV: RRR, normal S1, S2 Lungs: CTAB Abd: soft, appropriately tender, fundus firm below umbilicus Inc: dressing in place, clean/dry/intact Peri: moderate lochia Ext: no TTP, +2 pitting edema in calves and thighs  Labs:  Lab Results  Component Value Date   WBC 9.3 12/17/2023   HGB 9.7 (L) 12/17/2023   HCT 29.9 (L) 12/17/2023   MCV 86.9 12/17/2023   PLT 178 12/17/2023   Lab Results  Component Value Date   CREATININE 0.73 12/17/2023   Lab Results  Component Value Date   ALT 29 12/17/2023   AST 28 12/17/2023   ALKPHOS 74 12/17/2023   BILITOT 0.4 12/17/2023    A/P:  33 y.o.yo G1P0101 POD#3 s/p PCS (EBL 245 mL) with newly diagnosed gestational hypertension, doing well and progressing appropriately. Vitals notable for mildly elevated BP's otherwise within normal limits. Physical exam notable for lower extremity edema, lungs CTAB. Labs normal. Plan as follows:   #Routine OB - Regular diet, HLIV - ERAS for pain control  - Rh positive - DVT ppx: SCDs and Lovenox daily - BCM: Unsure  #Neonate - S/p circumcision -plans to breastfeed   #gHTN - PEC labs normal - UPC deferred in postpartum period - Lasix 40mg  PO now for diuresis - Will continue to monitor and hopeful for  discharge home this afternoon with close outpatient follow-up   Marlene Bast, MD

## 2023-12-18 ENCOUNTER — Encounter (HOSPITAL_COMMUNITY): Payer: Self-pay | Admitting: Obstetrics

## 2023-12-18 ENCOUNTER — Inpatient Hospital Stay (HOSPITAL_COMMUNITY)
Admission: AD | Admit: 2023-12-18 | Discharge: 2023-12-18 | Disposition: A | Discharge status: Home / Self Care | Payer: 59 | Attending: Obstetrics and Gynecology | Admitting: Obstetrics and Gynecology

## 2023-12-18 ENCOUNTER — Other Ambulatory Visit: Payer: Self-pay

## 2023-12-18 DIAGNOSIS — R0789 Other chest pain: Secondary | ICD-10-CM | POA: Diagnosis present

## 2023-12-18 DIAGNOSIS — R519 Headache, unspecified: Secondary | ICD-10-CM | POA: Diagnosis not present

## 2023-12-18 DIAGNOSIS — O9089 Other complications of the puerperium, not elsewhere classified: Secondary | ICD-10-CM | POA: Diagnosis not present

## 2023-12-18 DIAGNOSIS — O165 Unspecified maternal hypertension, complicating the puerperium: Secondary | ICD-10-CM | POA: Diagnosis not present

## 2023-12-18 DIAGNOSIS — R030 Elevated blood-pressure reading, without diagnosis of hypertension: Secondary | ICD-10-CM | POA: Diagnosis present

## 2023-12-18 DIAGNOSIS — R0602 Shortness of breath: Secondary | ICD-10-CM | POA: Diagnosis present

## 2023-12-18 LAB — COMPREHENSIVE METABOLIC PANEL
ALT: 39 U/L (ref 0–44)
AST: 35 U/L (ref 15–41)
Albumin: 2.8 g/dL — ABNORMAL LOW (ref 3.5–5.0)
Alkaline Phosphatase: 97 U/L (ref 38–126)
Anion gap: 10 (ref 5–15)
BUN: 12 mg/dL (ref 6–20)
CO2: 22 mmol/L (ref 22–32)
Calcium: 9.2 mg/dL (ref 8.9–10.3)
Chloride: 105 mmol/L (ref 98–111)
Creatinine, Ser: 0.63 mg/dL (ref 0.44–1.00)
GFR, Estimated: >60 mL/min (ref >60)
Glucose, Bld: 102 mg/dL — ABNORMAL HIGH (ref 70–99)
Potassium: 4.4 mmol/L (ref 3.5–5.1)
Sodium: 137 mmol/L (ref 135–145)
Total Bilirubin: 0.6 mg/dL (ref 0.0–1.2)
Total Protein: 6.4 g/dL — ABNORMAL LOW (ref 6.5–8.1)

## 2023-12-18 LAB — CBC
HCT: 31.8 % — ABNORMAL LOW (ref 36.0–46.0)
Hemoglobin: 10.8 g/dL — ABNORMAL LOW (ref 12.0–15.0)
MCH: 28.5 pg (ref 26.0–34.0)
MCHC: 34 g/dL (ref 30.0–36.0)
MCV: 83.9 fL (ref 80.0–100.0)
Platelets: 239 10*3/uL (ref 150–400)
RBC: 3.79 MIL/uL — ABNORMAL LOW (ref 3.87–5.11)
RDW: 15.2 % (ref 11.5–15.5)
WBC: 11 10*3/uL — ABNORMAL HIGH (ref 4.0–10.5)
nRBC: 0 % (ref 0.0–0.2)

## 2023-12-18 MED ORDER — METOCLOPRAMIDE HCL 5 MG/ML IJ SOLN
10.0000 mg | Freq: Once | INTRAMUSCULAR | Status: DC
  Filled 2023-12-18: qty 2

## 2023-12-18 MED ORDER — IBUPROFEN 800 MG PO TABS
800.0000 mg | ORAL_TABLET | Freq: Once | ORAL | Status: AC
Start: 2023-12-18 — End: 2023-12-18
  Administered 2023-12-18: 800 mg via ORAL
  Filled 2023-12-18: qty 1

## 2023-12-18 MED ORDER — LACTATED RINGERS IV BOLUS: 1000.0000 mL | Freq: Once | INTRAVENOUS | Status: DC

## 2023-12-18 MED ORDER — DEXAMETHASONE SODIUM PHOSPHATE 10 MG/ML IJ SOLN
10.0000 mg | Freq: Once | INTRAMUSCULAR | Status: DC
  Filled 2023-12-18: qty 1

## 2023-12-18 MED ORDER — METOCLOPRAMIDE HCL 10 MG PO TABS
10.0000 mg | ORAL_TABLET | Freq: Once | ORAL | Status: AC
  Administered 2023-12-18: 10 mg via ORAL
  Filled 2023-12-18: qty 1

## 2023-12-18 MED ORDER — DIPHENHYDRAMINE HCL 25 MG PO CAPS
25.0000 mg | ORAL_CAPSULE | Freq: Once | ORAL | Status: AC
  Administered 2023-12-18: 25 mg via ORAL
  Filled 2023-12-18: qty 1

## 2023-12-18 MED ORDER — DIPHENHYDRAMINE HCL 50 MG/ML IJ SOLN
25.0000 mg | Freq: Once | INTRAMUSCULAR | Status: DC
Start: 2023-12-18 — End: 2023-12-18
  Filled 2023-12-18: qty 1

## 2023-12-18 NOTE — Progress Notes (Signed)
 RN attempted IV insertion x2. Pt requesting to hold off on IV and wants to try PO medications. Pt states she is OK with phlebotomy drawing lab work. CNM made aware.

## 2023-12-18 NOTE — MAU Provider Note (Signed)
 History     CSN: [id number]  Arrival date and time: 12/18/23 0041   Event Date/Time   First Provider Initiated Contact with Patient 12/18/23 0108      Chief Complaint  Patient presents with   Hypertension   Headache    Shelley Whitehead is a 33 y.o. G1P0101 at 4 days Postpartum  who was discharged Saturday (2/22) afternoon.  She receives care at  [facility].  She presents today for elevated blood pressure at home.  Patient with postpartum hypertension and taking procardia and lasix.  Patient reports concern with bps in 160s/100s. Patient states she has HA on her right side that radiates to the back of her head.  She describes it as a constant stabbing pain that causes "white flashes and dizziness."  She reports taking tylenol w/o relief and rates the pain a 10/10.  Patient also reports some SOB when she breathes in that she also describes as a stabbing pain.  She states this pain has been present since prior to her hospital admission.  She notes some shoulder pain that also started after her delivering that she describes as "stabbing."  Patient denies issues with bleeding. Patient report she has had ~ 4 hours of sleep since her admission.   OB History     Gravida  1   Para  1   Term  0   Preterm  1   AB  0   Living  1      SAB  0   IAB  0   Ectopic  0   Multiple  0   Live Births  1           Past Medical History:  Diagnosis Date   ADD (attention deficit disorder)    Anxiety    Dysmenorrhea    Thyroid disease    hypothyroid    Past Surgical History:  Procedure Laterality Date   CESAREAN SECTION N/A 12/14/2023   Procedure: CESAREAN SECTION;  Surgeon: D'Iorio, Antony Salmon, MD;  Location: [facility] LD ORS;  Service: Obstetrics;  Laterality: N/A;   polyp removed as child      Family History  Problem Relation Age of Onset   Diabetes Mother    Heart disease Mother    Cervical cancer Mother    Allergic rhinitis Father    Breast cancer Paternal Grandmother     Angioedema Neg Hx    Asthma Neg Hx    Atopy Neg Hx    Urticaria Neg Hx     Social History   Tobacco Use   Smoking status: Never   Smokeless tobacco: Never  Vaping Use   Vaping status: Never Used  Substance Use Topics   Alcohol use: Not Currently    Alcohol/week: 2.0 - 3.0 standard drinks of alcohol    Types: 2 - 3 Standard drinks or equivalent per week   Drug use: Not Currently    Types: Marijuana    Comment: seldom    Allergies:  Allergies  Allergen Reactions   Peanut-Containing Drug Products Swelling   Soy Allergy (Obsolete) Hives    Medications Prior to Admission  Medication Sig Dispense Refill Last Dose/Taking   acetaminophen (TYLENOL) 500 MG tablet Take 2 tablets (1,000 mg total) by mouth every 6 (six) hours for 7 days.   12/17/2023 at  8:30 PM   furosemide (LASIX) 20 MG tablet Take 2 tablets (40 mg total) by mouth daily for 5 days. 10 tablet 0 12/17/2023   levothyroxine (SYNTHROID) 50  MCG tablet Take 1 tablet (50 mcg total) by mouth daily. 90 tablet 1 12/17/2023   NIFEdipine (ADALAT CC) 30 MG 24 hr tablet Take 1 tablet (30 mg total) by mouth daily. 60 tablet 1 12/17/2023   ibuprofen (ADVIL) 200 MG tablet Take 3 tablets (600 mg total) by mouth every 6 (six) hours for 7 days.      polyethylene glycol (MIRALAX) 17 g packet Take 17 g by mouth daily.      Prenatal Vit-Fe Fumarate-FA (PRENATAL MULTIVITAMIN) TABS tablet Take 1 tablet by mouth daily at 12 noon.       Review of Systems  Constitutional:  Negative for chills and fever.  Gastrointestinal:  Positive for abdominal pain. Negative for nausea and vomiting.  Genitourinary:  Negative for difficulty urinating, dysuria and vaginal discharge.  Neurological:  Positive for dizziness and headaches. Negative for light-headedness.   Physical Exam   Blood pressure 136/73, pulse (!) 59, temperature 98.2 F (36.8 C), temperature source Oral, resp. rate 19, height 5\' 2"  (1.575 m), weight 113.3 kg, last menstrual period  01/04/2023, SpO2 98%, unknown if currently breastfeeding.  Physical Exam Vitals and nursing note reviewed. Exam conducted with a chaperone present [facility], Charity fundraiser).  Constitutional:      Appearance: She is well-developed.     Comments: Appears disheveled and falls asleep between questions.   HENT:     Head: Normocephalic and atraumatic.  Eyes:     Conjunctiva/sclera: Conjunctivae normal.  Cardiovascular:     Rate and Rhythm: Normal rate.  Pulmonary:     Effort: Pulmonary effort is normal. No respiratory distress.     Breath sounds: Normal breath sounds.  Abdominal:     Palpations: Abdomen is soft.     Tenderness: There is abdominal tenderness (Appropriately).     Comments: Honeycomb dressing CDI  Musculoskeletal:        General: Normal range of motion.     Cervical back: Normal range of motion.  Skin:    General: Skin is warm and dry.  Neurological:     Mental Status: She is oriented to person, place, and time. She is lethargic.  Psychiatric:        Mood and Affect: Mood normal.        Behavior: Behavior normal.     MAU Course  Procedures Results for orders placed or performed during the hospital encounter of 12/18/23 (from the past 24 hours)  CBC     Status: Abnormal   Collection Time: 12/18/23  2:12 AM  Result Value Ref Range   WBC 11.0 (H) 4.0 - 10.5 K/uL   RBC 3.79 (L) 3.87 - 5.11 MIL/uL   Hemoglobin 10.8 (L) 12.0 - 15.0 g/dL   HCT 13.0 (L) 86.5 - 78.4 %   MCV 83.9 80.0 - 100.0 fL   MCH 28.5 26.0 - 34.0 pg   MCHC 34.0 30.0 - 36.0 g/dL   RDW 69.6 29.5 - 28.4 %   Platelets 239 150 - 400 K/uL   nRBC 0.0 0.0 - 0.2 %  Comprehensive metabolic panel     Status: Abnormal   Collection Time: 12/18/23  2:12 AM  Result Value Ref Range   Sodium 137 135 - 145 mmol/L   Potassium 4.4 3.5 - 5.1 mmol/L   Chloride 105 98 - 111 mmol/L   CO2 22 22 - 32 mmol/L   Glucose, Bld 102 (H) 70 - 99 mg/dL   BUN 12 6 - 20 mg/dL   Creatinine, Ser 1.32 0.44 -  1.00 mg/dL   Calcium 9.2 8.9 -  84.6 mg/dL   Total Protein 6.4 (L) 6.5 - 8.1 g/dL   Albumin 2.8 (L) 3.5 - 5.0 g/dL   AST 35 15 - 41 U/L   ALT 39 0 - 44 U/L   Alkaline Phosphatase 97 38 - 126 U/L   Total Bilirubin 0.6 0.0 - 1.2 mg/dL   GFR, Estimated >96 >29 mL/min   Anion gap 10 5 - 15    MDM Physical Exam Labs: CBC, CMP Measure BPQ15 min EFM Pain Management Assessment and Plan  33 year old Postpartum HA PP HTN  -Reviewed POC with patient. -Discussed rest while awaiting results.  -Educated on how lack of sleep can cause manifestation of or worsening of symptoms.  -BP normal, but will perform labs to r/o PP PreE. -Exam performed and findings discussed.  -Will start with IV treatment for HA. -Reglan, Decadron, and Benadryl Ordered as well as LR Bolus -Monitor and reassess.   Cherre Robins 12/18/2023, 1:08 AM   Reassessment (3:33 AM)  -Patient reports all symptoms have resolved.  -However, she states now she is having left side pain that is sharp. -Exam c/w musculoskeletal Tenderness. Discussed how this could be r/t recent surgery especially as manual contraction is performed during procedure. -Encouraged heat/cold compress to area and report lack of improvement. -Discussed continued taking of procardia for bps and ibuprofen dosing for HA. -Patient verbalizes understanding and without questions. -Patient to follow up with primary office as scheduled or return to MAU as needed. -Discharged to home in stable condition.  Cherre Robins MSN, CNM Advanced Practice Provider, [facility] [employer] [facility]

## 2023-12-18 NOTE — MAU Note (Addendum)
.  Shelley Whitehead is a 33 y.o. here in MAU reporting: PP c/s - was d/c home earlier. Got home and started to get migraine - elevated BP of 160s/110s. Having SOB, sharp chest and shoulder pain, and nausea. Took 3 tylenol at 2030. Reports seeing white flashes. Was given procardia and lasix prior to d/c today.   Onset of complaint: 1800 Pain score: 10 - HA; 10 - chest; 10 - shoulder Vitals:   12/18/23 0053  BP: 136/73  Pulse: (!) 59  Resp: 19  Temp: 98.2 F (36.8 C)  SpO2: 98%     FHT: pt is PP   Lab orders placed from triage: none

## 2023-12-23 ENCOUNTER — Telehealth (HOSPITAL_COMMUNITY): Payer: Self-pay

## 2023-12-23 NOTE — Telephone Encounter (Signed)
 12/23/2023 1854  Name: Shelley Whitehead MRN: [id number] DOB: 1991/08/17  Reason for Call:  Transition of Care Hospital Discharge Call  Contact Status: Patient Contact Status: Message  Language assistant needed:          Follow-Up Questions:    Inocente Salles Postnatal Depression Scale:  In the Past 7 Days:    PHQ2-9 Depression Scale:     Discharge Follow-up:    Post-discharge interventions: NA  Signature  Signe Colt

## 2024-01-11 ENCOUNTER — Inpatient Hospital Stay (HOSPITAL_COMMUNITY): Admission: RE | Admit: 2024-01-11 | Payer: 59 | Source: Home / Self Care | Admitting: Obstetrics & Gynecology

## 2024-01-26 ENCOUNTER — Encounter: Payer: Self-pay | Admitting: Obstetrics & Gynecology

## 2024-02-06 DIAGNOSIS — E039 Hypothyroidism, unspecified: Secondary | ICD-10-CM

## 2024-02-08 IMAGING — MR MR HEAD WO/W CM
18 of 20 series · 42 of 48 positions shown · IV contrast (gadavist)
Comparison: Head CT 02/17/2022.

CLINICAL DATA: 30-year-old female with possible seizure. Dizziness.
Altered mental status.

EXAM:
MRI HEAD WITHOUT AND WITH CONTRAST
TECHNIQUE: Multiplanar, multiecho pulse sequences of the brain and surrounding
structures were obtained without and with intravenous contrast.
CONTRAST:  9mL GADAVIST GADOBUTROL 1 MMOL/ML IV SOLN

[Series 5: DWI · axial · 3.0mm · 0.88mm/px · z∈[-77,+82]mm · 5 of 108 slices shown (1 of 4)]
[im 1/108]
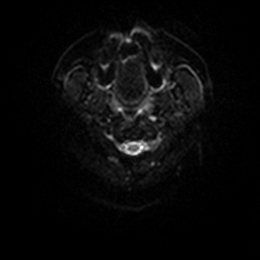
[im 27/108]
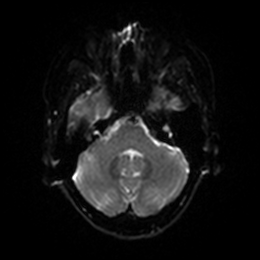
[im 54/108]
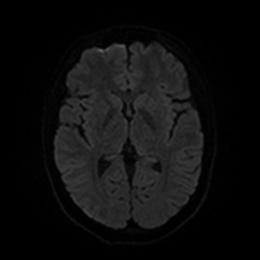
[im 81/108]
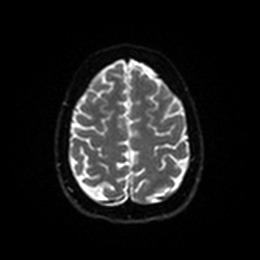
[im 108/108]
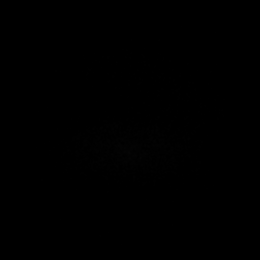

[Series 6: DWI · axial · 3.0mm · 0.88mm/px · z∈[-77,+82]mm · 3 of 54 slices shown (2 of 4)]
[im 1/54]
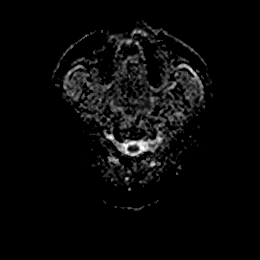
[im 27/54]
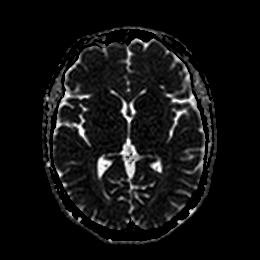
[im 54/54]
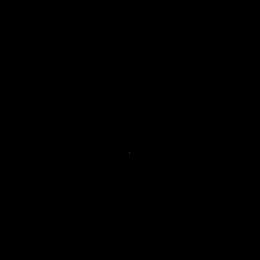

[Series 7: DWI · coronal · 4.0mm · 0.88mm/px · 4 of 78 slices shown (3 of 4)]
[im 1/78]
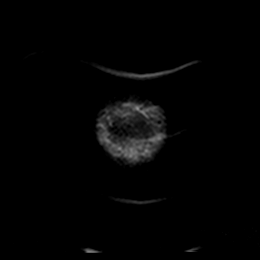
[im 26/78]
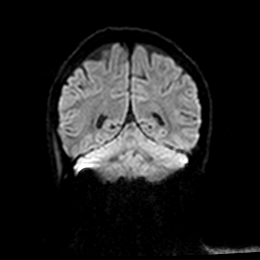
[im 52/78]
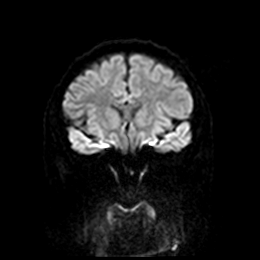
[im 78/78]
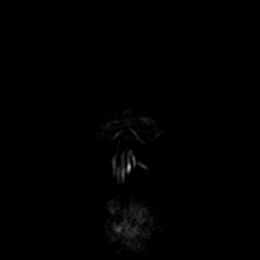

[Series 8: DWI · coronal · 4.0mm · 0.88mm/px · 2 of 39 slices shown (4 of 4)]
[im 1/39]
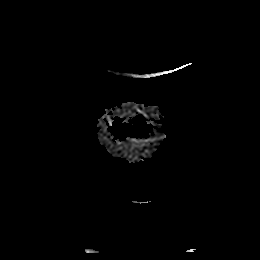
[im 39/39]
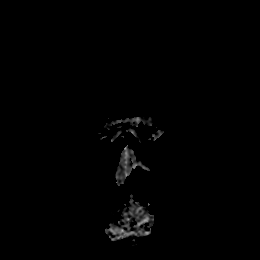

[Series 9: T1 · sagittal · 5.0mm · 0.75mm/px · 1 of 27 slices shown]
[im 1/27]
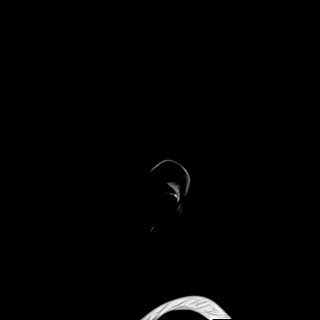

[Series 10: T2 · axial · 5.0mm · 0.72mm/px · 1 of 28 slices shown (1 of 2)]
[im 1/28]
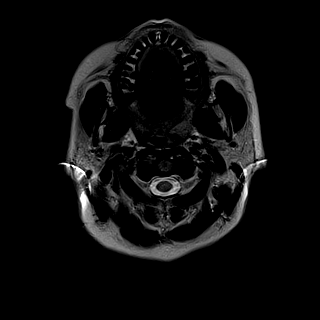

[Series 11: FLAIR · axial · 5.0mm · 0.45mm/px · 1 of 28 slices shown (1 of 2)]
[im 1/28]
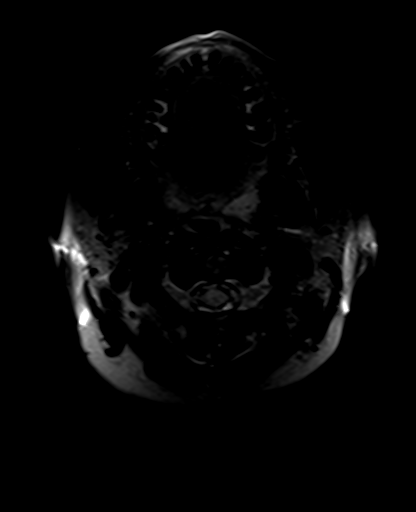

[Series 12: mag_images · axial · 3.0mm · 0.90mm/px · z∈[-95,+81]mm · 2 of 60 slices shown]
[im 1/60]
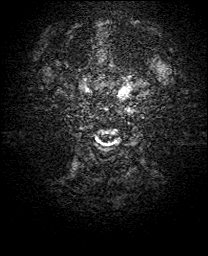
[im 60/60]
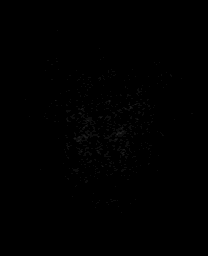

[Series 13: pha_images · axial · 3.0mm · 0.90mm/px · z∈[-95,+66]mm · 2 of 55 slices shown]
[im 1/55]
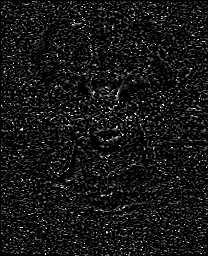
[im 55/55]
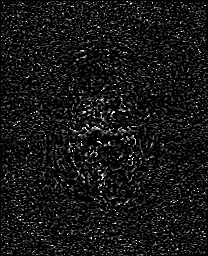

[Series 14: swi_images · axial · 3.0mm · 0.90mm/px · z∈[-95,+81]mm · 2 of 60 slices shown]
[im 1/60]
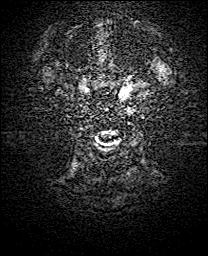
[im 60/60]
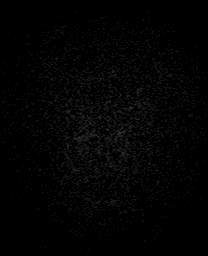

[Series 15: mip_images(sw) · axial · 24.0mm · 0.90mm/px · z∈[-85,+70]mm · 2 of 53 slices shown]
[im 1/53]
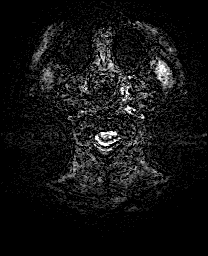
[im 53/53]
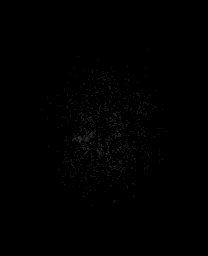

[Series 17: t1_mprage_tra_p2_iso · axial · 1.0mm · 0.98mm/px · z∈[-75,+83]mm · 7 of 160 slices shown]
[im 1/160]
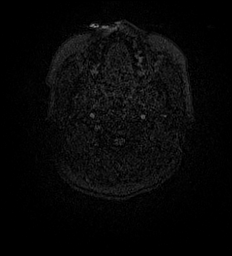
[im 27/160]
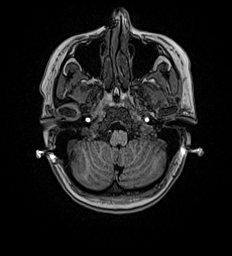
[im 54/160]
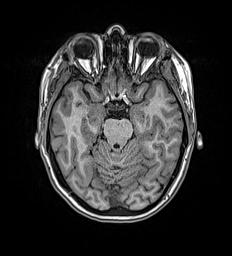
[im 80/160]
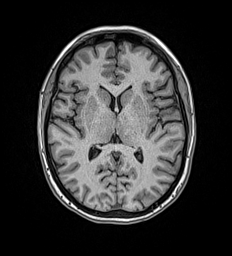
[im 107/160]
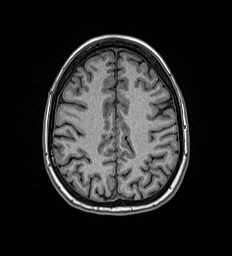
[im 133/160]
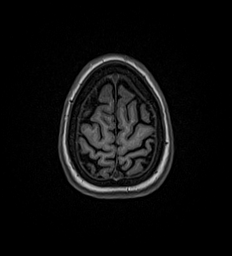
[im 160/160]
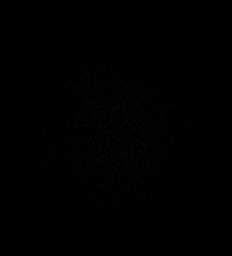

[Series 18: t1_mprage_tra_p2_iso_mpr_coronal · coronal · 1.7mm · 0.45mm/px · 3 of 120 slices shown]
[im 1/120]
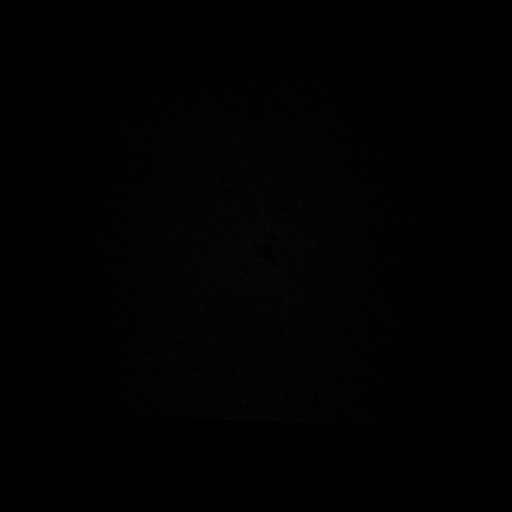
[im 30/120]
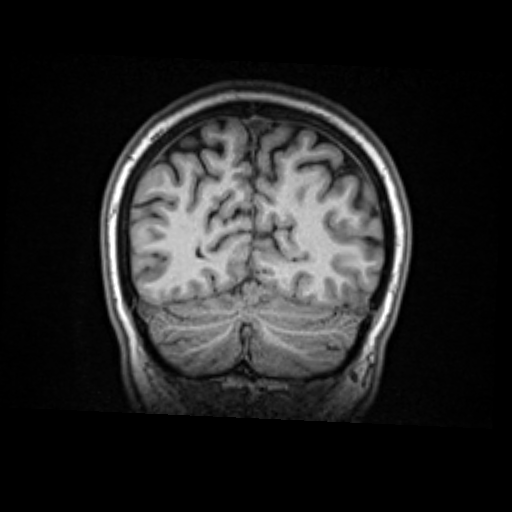
[im 60/120]
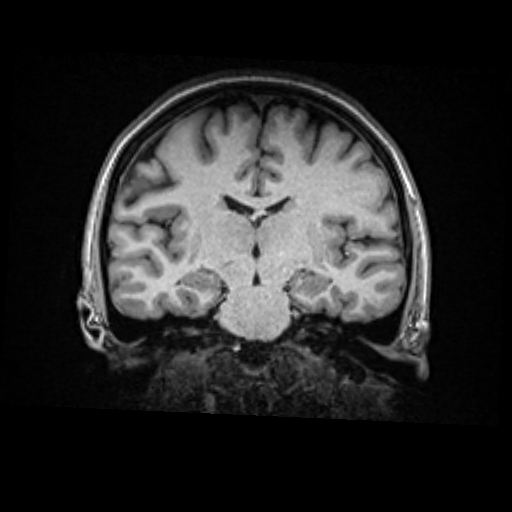

[Series 19: T2 · coronal · 3.0mm · 0.30mm/px · 2 of 42 slices shown (2 of 2)]
[im 1/42]
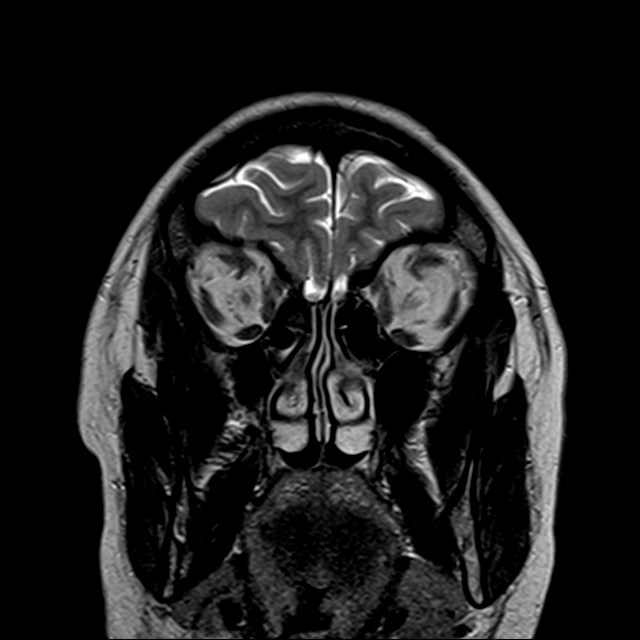
[im 42/42]
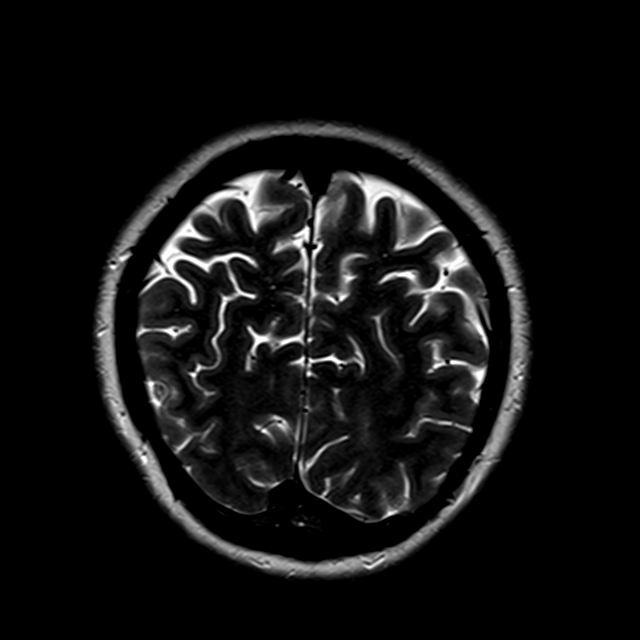

[Series 20: FLAIR · coronal · 3.0mm · 0.56mm/px · 2 of 38 slices shown (2 of 2)]
[im 1/38]
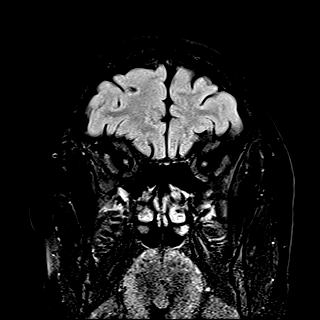
[im 38/38]
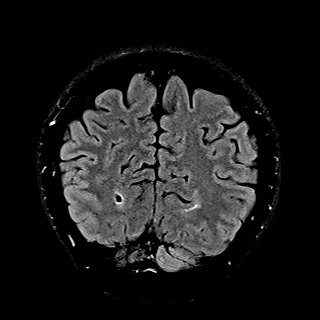

[Series 21: T2 post-contrast · coronal · 5.0mm · 0.72mm/px · 1 of 33 slices shown]
[im 1/33]
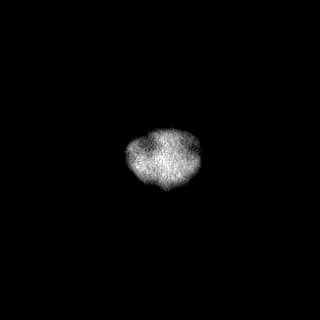

[Series 23: T1 post-contrast · coronal · 5.0mm · 0.34mm/px · 1 of 33 slices shown (1 of 2)]
[im 1/33]
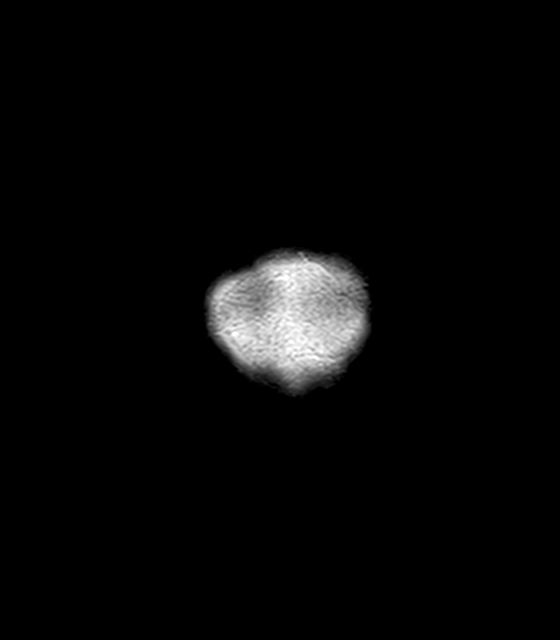

[Series 24: T1 post-contrast · sagittal · 5.0mm · 0.72mm/px · 1 of 27 slices shown (2 of 2)]
[im 1/27]
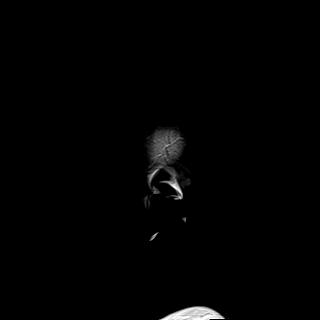

[42 of 48 positions shown; findings below may reference images not displayed]

FINDINGS: Brain: Normal cerebral volume. No restricted diffusion to suggest
acute infarction. No midline shift, mass effect, evidence of mass
lesion, ventriculomegaly, extra-axial collection or acute
intracranial hemorrhage. Cervicomedullary junction and pituitary are
within normal limits.

Gray and white matter signal is within normal limits throughout the
brain. On thin slice coronal imaging the hippocampal formations
appear symmetric and within normal limits (series 19, image 18).
Other mesial temporal lobe structures appear symmetric and normal.
No encephalomalacia or chronic cerebral blood products identified.

No abnormal enhancement identified.  No dural thickening.

Vascular: Major intracranial vascular flow voids are preserved. The
distal right vertebral artery appears dominant. The major dural
venous sinuses are enhancing and appear to be patent.

Skull and upper cervical spine: Normal visible cervical spine.
Visualized bone marrow signal is within normal limits.

Sinuses/Orbits: Negative.

Other: Grossly normal visible internal auditory structures. Mastoids
are clear. Normal stylomastoid foramina, visible scalp and face.
IMPRESSION: Normal MRI appearance of the brain.

## 2024-02-08 MED ORDER — LEVOTHYROXINE SODIUM 50 MCG PO TABS: 50.0000 ug | ORAL_TABLET | Freq: Every day | ORAL | 1 refills | Status: DC

## 2024-02-15 ENCOUNTER — Other Ambulatory Visit: Payer: Self-pay

## 2024-02-15 ENCOUNTER — Ambulatory Visit (INDEPENDENT_AMBULATORY_CARE_PROVIDER_SITE_OTHER)

## 2024-02-15 ENCOUNTER — Ambulatory Visit
Admission: RE | Admit: 2024-02-15 | Discharge: 2024-02-15 | Disposition: A | Discharge status: Home / Self Care | Source: Ambulatory Visit | Attending: Family Medicine | Admitting: Family Medicine

## 2024-02-15 VITALS — BP 134/84 | HR 69 | Temp 98.3°F | Resp 17

## 2024-02-15 DIAGNOSIS — S93402A Sprain of unspecified ligament of left ankle, initial encounter: Secondary | ICD-10-CM | POA: Diagnosis not present

## 2024-02-15 DIAGNOSIS — M79672 Pain in left foot: Secondary | ICD-10-CM

## 2024-02-15 DIAGNOSIS — S9032XA Contusion of left foot, initial encounter: Secondary | ICD-10-CM

## 2024-02-15 DIAGNOSIS — M25572 Pain in left ankle and joints of left foot: Secondary | ICD-10-CM

## 2024-02-15 NOTE — Discharge Instructions (Signed)
 Your x-rays were negative for fracture.  Please continue elevation ice and the wrap to your foot and ankle.  He may take over-the-counter Tylenol  as needed.  Please follow-up with your PCP if your symptoms do not improve.  Please go to the ER for any worsening symptoms.  Hope you feel better soon!

## 2024-02-15 NOTE — ED Provider Notes (Signed)
 [facility]    CSN: [id number] Arrival date & time: 02/15/24  1547      History   Chief Complaint Chief Complaint  Patient presents with   Foot Injury    Entered by patient    HPI Shelley Whitehead is a 33 y.o. female presents for ankle and foot pain.  Patient reports 4 days ago she stepped in a hole and rolled her left ankle/foot.  No head injury or LOC.  Reports inability to bear weight at time of injury but now can bear weight with discomfort.  Endorses bruising swelling but no numbness or tingling.  No history of left foot or ankle injuries/surgeries.  She has been doing RICE therapy and OTC Tylenol  for symptoms.  No other concerns at this time.   Foot Injury   Past Medical History:  Diagnosis Date   ADD (attention deficit disorder)    Anxiety    Dysmenorrhea    Thyroid  disease    hypothyroid    Patient Active Problem List   Diagnosis Date Noted   Gestational hypertension 12/17/2023   Preterm premature rupture of membranes (PPROM) delivered, current hospitalization 12/14/2023   Delivery of pregnancy by cesarean section 12/14/2023   Breech presentation delivered 12/14/2023   Other headache syndrome 05/06/2021   Panic attacks 02/13/2019   Acquired hypothyroidism 12/01/2018   ADD (attention deficit disorder) 07/27/2013   Depression 01/14/2011    Past Surgical History:  Procedure Laterality Date   CESAREAN SECTION N/A 12/14/2023   Procedure: CESAREAN SECTION;  Surgeon: D'Iorio, Tresa Frohlich, MD;  Location: [facility] LD ORS;  Service: Obstetrics;  Laterality: N/A;   polyp removed as child      OB History     Gravida  1   Para  1   Term  0   Preterm  1   AB  0   Living  1      SAB  0   IAB  0   Ectopic  0   Multiple  0   Live Births  1            Home Medications    Prior to Admission medications   Medication Sig Start Date End Date Taking? Authorizing Provider  norethindrone (MICRONOR) 0.35 MG tablet Take 1 tablet by mouth daily.  01/24/24  Yes [provider]  furosemide  (LASIX ) 20 MG tablet Take 2 tablets (40 mg total) by mouth daily for 5 days. 12/17/23 12/22/23  D'Iorio, Tresa Frohlich, MD  levothyroxine  (SYNTHROID ) 50 MCG tablet Take 1 tablet (50 mcg total) by mouth daily. 02/08/24   Gaylyn Keas Mary-Margaret, FNP  NIFEdipine  (ADALAT  CC) 30 MG 24 hr tablet Take 1 tablet (30 mg total) by mouth daily. 12/17/23   D'Iorio, Tresa Frohlich, MD  polyethylene glycol (MIRALAX ) 17 g packet Take 17 g by mouth daily. 12/16/23   D'Iorio, Tresa Frohlich, MD  Prenatal Vit-Fe Fumarate-FA (PRENATAL MULTIVITAMIN) TABS tablet Take 1 tablet by mouth daily at 12 noon.    [provider]    Family History Family History  Problem Relation Age of Onset   Diabetes Mother    Heart disease Mother    Cervical cancer Mother    Allergic rhinitis Father    Breast cancer Paternal Grandmother    Angioedema Neg Hx    Asthma Neg Hx    Atopy Neg Hx    Urticaria Neg Hx     Social History Social History   Tobacco Use   Smoking status: Never  Smokeless tobacco: Never  Vaping Use   Vaping status: Never Used  Substance Use Topics   Alcohol use: Not Currently    Alcohol/week: 2.0 - 3.0 standard drinks of alcohol    Types: 2 - 3 Standard drinks or equivalent per week   Drug use: Not Currently    Types: Marijuana    Comment: seldom     Allergies   Peanut-containing drug products and Soy allergy (obsolete)   Review of Systems Review of Systems  Musculoskeletal:        Left ankle and foot injury      Physical Exam Triage Vital Signs ED Triage Vitals  Encounter Vitals Group     BP 02/15/24 1557 134/84     Systolic BP Percentile --      Diastolic BP Percentile --      Pulse Rate 02/15/24 1557 69     Resp 02/15/24 1557 17     Temp 02/15/24 1557 98.3 F (36.8 C)     Temp Source 02/15/24 1557 Oral     SpO2 02/15/24 1557 98 %     Weight --      Height --      Head Circumference --      Peak Flow --      Pain Score 02/15/24 1556 7      Pain Loc --      Pain Education --      Exclude from Growth Chart --    No data found.  Updated Vital Signs BP 134/84   Pulse 69   Temp 98.3 F (36.8 C) (Oral)   Resp 17   LMP 04/15/2023   SpO2 98%   Breastfeeding Yes   Visual Acuity Right Eye Distance:   Left Eye Distance:   Bilateral Distance:    Right Eye Near:   Left Eye Near:    Bilateral Near:     Physical Exam Vitals and nursing note reviewed.  Constitutional:      General: She is not in acute distress.    Appearance: Normal appearance. She is not ill-appearing.  HENT:     Head: Normocephalic and atraumatic.  Eyes:     Pupils: Pupils are equal, round, and reactive to light.  Cardiovascular:     Rate and Rhythm: Normal rate.  Pulmonary:     Effort: Pulmonary effort is normal.  Musculoskeletal:       Feet:  Feet:     Comments: There is moderate swelling with ecchymosis to the left lateral malleolus with tenderness to palpation to site.  There is also bruising with mild swelling to the distal metatarsals with tenderness to palpation to site as well.  No medial malleolus tenderness with palpation.  Full range of motion of ankle with pain on both dorsi flexion and extension.  DP +2. Skin:    General: Skin is warm and dry.  Neurological:     General: No focal deficit present.     Mental Status: She is alert and oriented to person, place, and time.  Psychiatric:        Mood and Affect: Mood normal.        Behavior: Behavior normal.      UC Treatments / Results  Labs (all labs ordered are listed, but only abnormal results are displayed) Labs Reviewed - No data to display  EKG   Radiology DG Foot Complete Left Result Date: 02/15/2024 CLINICAL DATA:  rolled ankle 4 days ago EXAM: LEFT FOOT - COMPLETE 3+  VIEW COMPARISON:  None Available. FINDINGS: No acute fracture or dislocation. There is no evidence of arthropathy or other focal bone abnormality. Soft tissues are unremarkable. No radiopaque foreign body.  IMPRESSION: No acute fracture or dislocation. Electronically Signed   By: Rance Burrows M.D.   On: 02/15/2024 16:46   DG Ankle Complete Left Result Date: 02/15/2024 CLINICAL DATA:  rolled ankle 4 days ago EXAM: LEFT ANKLE COMPLETE - 3+ VIEW COMPARISON:  None Available. FINDINGS: No acute fracture or dislocation. No ankle mortise widening. The talar dome is intact. There is no evidence of arthropathy or other focal bone abnormality. Soft tissues are unremarkable. IMPRESSION: No acute fracture or dislocation. Electronically Signed   By: Rance Burrows M.D.   On: 02/15/2024 16:45    Procedures Procedures (including critical care time)  Medications Ordered in UC Medications - No data to display  Initial Impression / Assessment and Plan / UC Course  I have reviewed the triage vital signs and the nursing notes.  Pertinent labs & imaging results that were available during my care of the patient were reviewed by me and considered in my medical decision making (see chart for details).     Reviewed exam and symptoms with patient.  No red flags.  X-ray of foot and ankle negative for fracture.  Discussed ankle sprain/contusion.  Continue RICE therapy and patient has ankle/foot wrap she has been using.  Will continue OTC Tylenol  as she is breast-feeding.  Advised PCP follow-up if symptoms do not improve.  ER precautions reviewed and patient verbalized understanding. Final Clinical Impressions(s) / UC Diagnoses   Final diagnoses:  Acute left ankle pain  Left foot pain  Sprain of left ankle, unspecified ligament, initial encounter  Contusion of left foot, initial encounter     Discharge Instructions      Your x-rays were negative for fracture.  Please continue elevation ice and the wrap to your foot and ankle.  He may take over-the-counter Tylenol  as needed.  Please follow-up with your PCP if your symptoms do not improve.  Please go to the ER for any worsening symptoms.  Hope you feel better  soon!     ED Prescriptions   None    PDMP not reviewed this encounter.   Alleen Arbour, NP 02/15/24 1655

## 2024-02-15 NOTE — ED Triage Notes (Signed)
 Pt states she fell off of a porch on Sunday and "rolled" her left ankle. Pt states it was 6 inches she fell. Pt denies hitting head. Pt has ecchymotic areas in left foot and all of her toes. Pt has 2+ left pedal pulse, able to wiggle toes, sensation intact. Pt walked to triage room. Pt has 2+ swelling of left upper foot.

## 2024-04-04 ENCOUNTER — Ambulatory Visit

## 2024-04-07 ENCOUNTER — Ambulatory Visit
Admission: RE | Admit: 2024-04-07 | Discharge: 2024-04-07 | Disposition: A | Discharge status: Home / Self Care | Source: Ambulatory Visit | Attending: Emergency Medicine

## 2024-04-07 ENCOUNTER — Other Ambulatory Visit: Payer: Self-pay

## 2024-04-07 ENCOUNTER — Ambulatory Visit (INDEPENDENT_AMBULATORY_CARE_PROVIDER_SITE_OTHER)

## 2024-04-07 VITALS — BP 119/79 | HR 62 | Temp 98.4°F | Resp 16

## 2024-04-07 DIAGNOSIS — M25531 Pain in right wrist: Secondary | ICD-10-CM

## 2024-04-07 DIAGNOSIS — M65931 Unspecified synovitis and tenosynovitis, right forearm: Secondary | ICD-10-CM

## 2024-04-07 NOTE — ED Notes (Signed)
Pt declined wrist brace

## 2024-04-07 NOTE — ED Provider Notes (Addendum)
 [facility]    CSN: [id number] Arrival date & time: 04/07/24  0857      History   Chief Complaint Chief Complaint  Patient presents with   Wrist Injury    HPI Shelley Whitehead is a 33 y.o. female.  Here with continued right wrist pain   Was seen here for a foot and ankle injury on 02/15/2024 (2 months ago). She had stepped in a pothole and rolled the ankle. At that time there was no mention of any hand or wrist injury. She did not know she hurt her wrist until a few weeks later. She thinks she may have caught herself on the right wrist   Pain with lifting, grasping, and giving a thumbs up. Has tried a brace but infrequently  Not having numbness/tingling   Past Medical History:  Diagnosis Date   ADD (attention deficit disorder)    Anxiety    Dysmenorrhea    Thyroid  disease    hypothyroid    Patient Active Problem List   Diagnosis Date Noted   Gestational hypertension 12/17/2023   Preterm premature rupture of membranes (PPROM) delivered, current hospitalization 12/14/2023   Delivery of pregnancy by cesarean section 12/14/2023   Breech presentation delivered 12/14/2023   Other headache syndrome 05/06/2021   Panic attacks 02/13/2019   Acquired hypothyroidism 12/01/2018   ADD (attention deficit disorder) 07/27/2013   Depression 01/14/2011    Past Surgical History:  Procedure Laterality Date   CESAREAN SECTION N/A 12/14/2023   Procedure: CESAREAN SECTION;  Surgeon: D'Iorio, Tresa Frohlich, MD;  Location: [facility];  Service: Obstetrics;  Laterality: N/A;   polyp removed as child      OB History     Gravida  1   Para  1   Term  0   Preterm  1   AB  0   Living  1      SAB  0   IAB  0   Ectopic  0   Multiple  0   Live Births  1            Home Medications    Prior to Admission medications   Medication Sig Start Date End Date Taking? Authorizing Provider  furosemide  (LASIX ) 20 MG tablet Take 2 tablets (40 mg total) by mouth daily for 5  days. 12/17/23 12/22/23  D'Iorio, Tresa Frohlich, MD  levothyroxine  (SYNTHROID ) 50 MCG tablet Take 1 tablet (50 mcg total) by mouth daily. 02/08/24   Gaylyn Keas, Mary-Margaret, FNP  NIFEdipine  (ADALAT  CC) 30 MG 24 hr tablet Take 1 tablet (30 mg total) by mouth daily. 12/17/23   D'Iorio, Tresa Frohlich, MD  norethindrone (MICRONOR) 0.35 MG tablet Take 1 tablet by mouth daily. 01/24/24   [provider]  polyethylene glycol (MIRALAX ) 17 g packet Take 17 g by mouth daily. 12/16/23   D'Iorio, Tresa Frohlich, MD  Prenatal Vit-Fe Fumarate-FA (PRENATAL MULTIVITAMIN) TABS tablet Take 1 tablet by mouth daily at 12 noon.    [provider]    Family History Family History  Problem Relation Age of Onset   Diabetes Mother    Heart disease Mother    Cervical cancer Mother    Allergic rhinitis Father    Breast cancer Paternal Grandmother    Angioedema Neg Hx    Asthma Neg Hx    Atopy Neg Hx    Urticaria Neg Hx     Social History Social History   Tobacco Use   Smoking status: Never  Smokeless tobacco: Never  Vaping Use   Vaping status: Never Used  Substance Use Topics   Alcohol use: Not Currently    Alcohol/week: 2.0 - 3.0 standard drinks of alcohol    Types: 2 - 3 Standard drinks or equivalent per week   Drug use: Not Currently    Types: Marijuana    Comment: seldom     Allergies   Peanut-containing drug products and Soy allergy (obsolete)   Review of Systems Review of Systems  As per HPI  Physical Exam Triage Vital Signs ED Triage Vitals  Encounter Vitals Group     BP 04/07/24 0908 119/79     Girls Systolic BP Percentile --      Girls Diastolic BP Percentile --      Boys Systolic BP Percentile --      Boys Diastolic BP Percentile --      Pulse Rate 04/07/24 0908 62     Resp 04/07/24 0908 16     Temp 04/07/24 0908 98.4 F (36.9 C)     Temp Source 04/07/24 0908 Oral     SpO2 04/07/24 0908 96 %     Weight --      Height --      Head Circumference --      Peak Flow --      Pain  Score 04/07/24 0906 4     Pain Loc --      Pain Education --      Exclude from Growth Chart --    No data found.  Updated Vital Signs BP 119/79   Pulse 62   Temp 98.4 F (36.9 C) (Oral)   Resp 16   SpO2 96%   Breastfeeding Yes   Visual Acuity Right Eye Distance:   Left Eye Distance:   Bilateral Distance:    Right Eye Near:   Left Eye Near:    Bilateral Near:     Physical Exam Vitals and nursing note reviewed.  Constitutional:      General: She is not in acute distress. HENT:     Mouth/Throat:     Pharynx: Oropharynx is clear.   Cardiovascular:     Rate and Rhythm: Normal rate and regular rhythm.     Pulses: Normal pulses.     Heart sounds: Normal heart sounds.  Pulmonary:     Effort: Pulmonary effort is normal.     Breath sounds: Normal breath sounds.   Musculoskeletal:     Right forearm: Normal. No swelling, deformity or tenderness.     Right wrist: No swelling, deformity, tenderness, bony tenderness or snuff box tenderness. Normal range of motion. Normal pulse.     Left wrist: Normal.     Cervical back: Normal range of motion.     Comments: +finklestein test. Full ROM of right wrist and all joints of fingers. No deformity or swelling. Grip strength intact, sensation normal. Cap refill of fingers < 2 seconds. Radial pulse 2+   Skin:    General: Skin is warm and dry.     Capillary Refill: Capillary refill takes less than 2 seconds.     Findings: No bruising or erythema.   Neurological:     Mental Status: She is alert and oriented to person, place, and time.     UC Treatments / Results  Labs (all labs ordered are listed, but only abnormal results are displayed) Labs Reviewed - No data to display  EKG   Radiology DG Wrist Complete Right Result Date:  04/07/2024 CLINICAL DATA:  fall 2 months ago.  Still having pain EXAM: RIGHT WRIST - COMPLETE 3+ VIEW COMPARISON:  None Available. FINDINGS: No acute fracture or dislocation. Joint spaces and alignment are  maintained. No area of erosion or osseous destruction. No unexpected radiopaque foreign body. Soft tissues are unremarkable. IMPRESSION: No acute fracture or dislocation. Electronically Signed   By: Clancy Crimes M.D.   On: 04/07/2024 09:50    Procedures Procedures (including critical care time)  Medications Ordered in UC Medications - No data to display  Initial Impression / Assessment and Plan / UC Course  I have reviewed the triage vital signs and the nursing notes.  Pertinent labs & imaging results that were available during my care of the patient were reviewed by me and considered in my medical decision making (see chart for details).  Right wrist xray without bony abnormality. Images independently reviewed by me, agree with radiology interpretation. Discussed likely tendonitis given duration and specific movements that cause pain, +finklestein test  Wrist brace is provided. However patient then declines and says she will get one at the store.  RICE therapy, pain control, advised following with orthopedics   Final Clinical Impressions(s) / UC Diagnoses   Final diagnoses:  Right wrist pain  Tenosynovitis of right wrist     Discharge Instructions      Xray does not show any issue with the bones! You likely have tendonitis as we discussed.  Use the wrist brace continually for support Ice the wrist 20 minutes at a time, several times daily For additional pain relief you can use both ibuprofen  and tylenol   Please follow up with orthopedics for further evaluation if symptoms are persisting. Call them to make an appointment.      ED Prescriptions   None    PDMP not reviewed this encounter.   Liev Brockbank, Beth Brooke 04/07/24 5366    Lindsie Simar, Ivette Marks, PA-C 04/07/24 1004

## 2024-04-07 NOTE — ED Triage Notes (Signed)
 Pt states her right wrist is still painful from her fall on 4/20. She said the pain intensified after the pain from her ankle went away 2 wks later. Pt states it's painful to life her baby. There is no swelling noted. Pt has 2+ right radial pulse, cap refill less than 3 sec, warm to touch. 5/5 right grip strength. Pt states it's more painful when giving a thumbs up

## 2024-04-07 NOTE — Discharge Instructions (Signed)
 Xray does not show any issue with the bones! You likely have tendonitis as we discussed.  Use the wrist brace continually for support Ice the wrist 20 minutes at a time, several times daily For additional pain relief you can use both ibuprofen  and tylenol   Please follow up with orthopedics for further evaluation if symptoms are persisting. Call them to make an appointment.

## 2024-09-14 ENCOUNTER — Encounter: Payer: Self-pay | Admitting: Nurse Practitioner

## 2024-09-14 ENCOUNTER — Other Ambulatory Visit: Payer: Self-pay | Admitting: Nurse Practitioner

## 2024-09-14 DIAGNOSIS — E039 Hypothyroidism, unspecified: Secondary | ICD-10-CM

## 2024-09-14 NOTE — Telephone Encounter (Signed)
 MMM NTBS last OV 06/07/23 NO RF sent to pharmacy last OV greater than a year

## 2024-09-14 NOTE — Telephone Encounter (Signed)
 LMTCB to schedule appt Letter mailed

## 2024-09-25 ENCOUNTER — Encounter: Payer: Self-pay | Admitting: Nurse Practitioner

## 2024-09-25 ENCOUNTER — Ambulatory Visit: Admitting: Nurse Practitioner

## 2024-09-25 VITALS — BP 121/72 | HR 69 | Temp 98.4°F | Ht 62.0 in | Wt 249.0 lb

## 2024-09-25 DIAGNOSIS — F410 Panic disorder [episodic paroxysmal anxiety] without agoraphobia: Secondary | ICD-10-CM

## 2024-09-25 DIAGNOSIS — F901 Attention-deficit hyperactivity disorder, predominantly hyperactive type: Secondary | ICD-10-CM

## 2024-09-25 DIAGNOSIS — F332 Major depressive disorder, recurrent severe without psychotic features: Secondary | ICD-10-CM

## 2024-09-25 MED ORDER — LISDEXAMFETAMINE DIMESYLATE 30 MG PO CAPS: 30.0000 mg | ORAL_CAPSULE | Freq: Every day | ORAL | 0 refills | Status: DC

## 2024-09-25 NOTE — Progress Notes (Signed)
 Subjective:    Patient ID: Shelley Whitehead, female    DOB: [birth year], 33 y.o.   MRN: [id number]   Chief Complaint: medical management of chronic issues     HPI:  Shelley Whitehead is a 33 y.o. who identifies as a female who was assigned female at birth.   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 She has been doing really well.   1. Attention deficit disorder (ADD) without hyperactivity Patient has been on ADHD meds for many years. Is not able to concentrate and complete work without meds. Since her pregnancy she has not been on adhd meds. She is ready to stop breast feeding. Wants to go back on meds once quits breast feeding  Control contract: 03/10/23- drug screen done today  2. Severe episode of recurrent major depressive disorder, without psychotic features (HCC) 3. Panic attacks Has been doing quite well. On no antidepressant.     06/07/2023    4:20 PM 03/10/2023    4:04 PM 09/03/2022    9:46 AM  Depression screen PHQ 2/9  Decreased Interest 0 0 0  Down, Depressed, Hopeless 0 0 0  PHQ - 2 Score 0 0 0  Altered sleeping 2 0 0  Tired, decreased energy 3 0 0  Change in appetite 3 0 0  Feeling bad or failure about yourself  0 0 0  Trouble concentrating 0 0 0  Moving slowly or fidgety/restless 0 0 0  Suicidal thoughts 0 0 0  PHQ-9 Score 8  0  0   Difficult doing work/chores Somewhat difficult Not difficult at all Not difficult at all     Data saved with a previous flowsheet row definition      06/07/2023    4:20 PM 03/10/2023    4:04 PM 09/03/2022    9:46 AM 05/25/2022    4:08 PM  GAD 7 : Generalized Anxiety Score  Nervous, Anxious, on Edge 0 2 2 0  Control/stop worrying 0 1 1 0  Worry too much - different things 0 1 1 0  Trouble relaxing 0 0 0 0  Restless 0 0 0 0  Easily annoyed or irritable 2  1 1  0  Afraid - awful might happen 0 1 1 0  Total GAD 7 Score 2 6 6  0  Anxiety Difficulty Not difficult at all Somewhat difficult Somewhat difficult Not difficult at all    Wt Readings from Last 3 Encounters:  09/25/24 249 lb (112.9 kg)  12/18/23 249 lb 11.2 oz (113.3 kg)  12/14/23 257 lb 12.8 oz (116.9 kg)   BMI Readings from Last 3 Encounters:  09/25/24 45.54 kg/m  12/18/23 45.67 kg/m  12/14/23 47.15 kg/m       New complaints: None today  Allergies  Allergen Reactions   Peanut-Containing Drug Products Swelling   Soy Allergy (Obsolete) Hives   Outpatient Encounter Medications as of 09/25/2024  Medication Sig   furosemide  (LASIX ) 20 MG tablet Take 2 tablets (40 mg total) by mouth daily for 5 days.   levothyroxine  (SYNTHROID ) 50 MCG tablet Take 1 tablet (50 mcg total) by mouth daily.   NIFEdipine  (ADALAT  CC) 30 MG 24 hr tablet Take 1 tablet (30 mg total) by mouth daily.   norethindrone (MICRONOR) 0.35 MG tablet Take 1 tablet by mouth daily.  polyethylene glycol (MIRALAX ) 17 g packet Take 17 g by mouth daily.   Prenatal Vit-Fe Fumarate-FA (PRENATAL MULTIVITAMIN) TABS tablet Take 1 tablet by mouth daily at 12 noon.   No facility-administered encounter medications on file as of 09/25/2024.    Past Surgical History:  Procedure Laterality Date   CESAREAN SECTION N/A 12/14/2023   Procedure: CESAREAN SECTION;  Surgeon: D'Iorio, Hadassah LABOR, MD;  Location: [facility];  Service: Obstetrics;  Laterality: N/A;   polyp removed as child      Family History  Problem Relation Age of Onset   Diabetes Mother    Heart disease Mother    Cervical cancer Mother    Allergic rhinitis Father    Breast cancer Paternal Grandmother    Angioedema Neg Hx    Asthma Neg Hx    Atopy Neg Hx    Urticaria Neg Hx       Controlled substance contract: n/a     Review of Systems  Constitutional:  Negative for diaphoresis.  Eyes:  Negative for pain.  Respiratory:  Negative for shortness of  breath.   Cardiovascular:  Negative for chest pain, palpitations and leg swelling.  Gastrointestinal:  Negative for abdominal pain.  Endocrine: Negative for polydipsia.  Skin:  Negative for rash.  Neurological:  Negative for dizziness, weakness and headaches.  Hematological:  Does not bruise/bleed easily.  All other systems reviewed and are negative.      Objective:   Physical Exam Vitals reviewed.  Constitutional:      Appearance: Normal appearance. She is obese.  Cardiovascular:     Rate and Rhythm: Normal rate and regular rhythm.     Heart sounds: Normal heart sounds.  Pulmonary:     Effort: Pulmonary effort is normal.     Breath sounds: Normal breath sounds.  Skin:    General: Skin is warm.  Neurological:     General: No focal deficit present.     Mental Status: She is alert and oriented to person, place, and time.  Psychiatric:        Mood and Affect: Mood normal.        Behavior: Behavior normal.    BP 121/72   Pulse 69   Temp 98.4 F (36.9 C) (Temporal)   Ht 5' 2 (1.575 m)   Wt 249 lb (112.9 kg)   SpO2 98%   BMI 45.54 kg/m          Assessment & Plan:  Shelley Whitehead in today with chief complaint of No chief complaint on file.   1. Attention deficit disorder (ADD) without hyperactivity Stress management - VYVANSE  40 MG capsule; Take 1 capsule (40 mg total) by mouth daily before breakfast.  Dispense: 30 capsule; Refill: 0 - VYVANSE  40 MG capsule; Take 1 capsule (40 mg total) by mouth daily before breakfast.  Dispense: 30 capsule; Refill: 0 - VYVANSE  40 MG capsule; Take 1 capsule (40 mg total) by mouth daily before breakfast.  Dispense: 30 capsule; Refill: 0 - ToxASSURE Select 13 (MW), Urine  2. Severe episode of recurrent major depressive disorder, without psychotic features (HCC)  3. Panic attacks  4. Morbid obesity Discussed wegovy and zepbound- she will contact her insurance. Discussed diet and exercise.   The above assessment and management  plan was discussed with the patient. The patient verbalized understanding of and has agreed to the management plan. Patient is aware to call the clinic if symptoms persist or worsen. Patient is aware when to return to the  clinic for a follow-up visit. Patient educated on when it is appropriate to go to the emergency department.   Mary-Margaret Gladis, FNP

## 2024-09-26 LAB — CBC WITH DIFFERENTIAL/PLATELET
Basophils Absolute: 0 x10E3/uL (ref 0.0–0.2)
Basos: 0 %
EOS (ABSOLUTE): 0.1 x10E3/uL (ref 0.0–0.4)
Eos: 1 %
Hematocrit: 40.3 % (ref 34.0–46.6)
Hemoglobin: 12.9 g/dL (ref 11.1–15.9)
Immature Grans (Abs): 0 x10E3/uL (ref 0.0–0.1)
Immature Granulocytes: 0 %
Lymphocytes Absolute: 2.6 x10E3/uL (ref 0.7–3.1)
Lymphs: 27 %
MCH: 27.9 pg (ref 26.6–33.0)
MCHC: 32 g/dL (ref 31.5–35.7)
MCV: 87 fL (ref 79–97)
Monocytes Absolute: 0.8 x10E3/uL (ref 0.1–0.9)
Monocytes: 8 %
Neutrophils Absolute: 6.2 x10E3/uL (ref 1.4–7.0)
Neutrophils: 64 %
Platelets: 293 x10E3/uL (ref 150–450)
RBC: 4.62 x10E6/uL (ref 3.77–5.28)
RDW: 13 % (ref 11.7–15.4)
WBC: 9.7 x10E3/uL (ref 3.4–10.8)

## 2024-09-26 LAB — CMP14+EGFR
ALT: 14 IU/L (ref 0–32)
AST: 17 IU/L (ref 0–40)
Albumin: 4.4 g/dL (ref 3.9–4.9)
Alkaline Phosphatase: 106 IU/L (ref 41–116)
BUN/Creatinine Ratio: 33 — ABNORMAL HIGH (ref 9–23)
BUN: 20 mg/dL (ref 6–20)
Bilirubin Total: 0.3 mg/dL (ref 0.0–1.2)
CO2: 20 mmol/L (ref 20–29)
Calcium: 9.7 mg/dL (ref 8.7–10.2)
Chloride: 100 mmol/L (ref 96–106)
Creatinine, Ser: 0.61 mg/dL (ref 0.57–1.00)
Globulin, Total: 2.8 g/dL (ref 1.5–4.5)
Glucose: 96 mg/dL (ref 70–99)
Potassium: 4.3 mmol/L (ref 3.5–5.2)
Sodium: 137 mmol/L (ref 134–144)
Total Protein: 7.2 g/dL (ref 6.0–8.5)
eGFR: 121 mL/min/1.73 (ref >59)

## 2024-09-26 LAB — LIPID PANEL
Chol/HDL Ratio: 3.5 ratio (ref 0.0–4.4)
Cholesterol, Total: 178 mg/dL (ref 100–199)
HDL: 51 mg/dL (ref >39)
LDL Chol Calc (NIH): 110 mg/dL — ABNORMAL HIGH (ref 0–99)
Triglycerides: 91 mg/dL (ref 0–149)
VLDL Cholesterol Cal: 17 mg/dL (ref 5–40)

## 2024-09-26 LAB — BAYER DCA HB A1C WAIVED: HB A1C (BAYER DCA - WAIVED): 5.4 % (ref 4.8–5.6)

## 2024-09-27 ENCOUNTER — Ambulatory Visit: Payer: Self-pay | Admitting: Nurse Practitioner

## 2024-09-28 NOTE — Telephone Encounter (Signed)
 Please add thyroid  to [facility] labs- hypothyroidism

## 2024-10-03 LAB — SPECIMEN STATUS REPORT

## 2024-10-03 LAB — THYROID PANEL WITH TSH
Free Thyroxine Index: 1.6 (ref 1.2–4.9)
T3 Uptake Ratio: 25 % (ref 24–39)
T4, Total: 6.2 ug/dL (ref 4.5–12.0)
TSH: 10.8 u[IU]/mL — AB (ref 0.450–4.500)

## 2024-10-04 MED ORDER — LEVOTHYROXINE SODIUM 75 MCG PO TABS: 75.0000 ug | ORAL_TABLET | Freq: Every day | ORAL | 1 refills | Status: DC

## 2024-10-05 ENCOUNTER — Telehealth: Payer: Self-pay | Admitting: *Deleted

## 2024-10-05 MED ORDER — LEVOTHYROXINE SODIUM 75 MCG PO TABS: 75.0000 ug | ORAL_TABLET | Freq: Every day | ORAL | 1 refills | Status: AC

## 2024-10-05 NOTE — Telephone Encounter (Signed)
 Pt aware thyroid  medication sent to requested pharmacy

## 2024-10-05 NOTE — Telephone Encounter (Signed)
 Copied from CRM #[id number]. Topic: Clinical - Prescription Issue >> Oct 04, 2024  4:59 PM Zebedee SAUNDERS wrote: Reason for CRM: Pt want to know if levothyroxine  (SYNTHROID ) 75 MCG tablet, can be sent to:[id number] [address] [facility] - [address] [place] [address] Phone: [phone] Fax: [phone] Please call pt at [phone] to let her know.

## 2024-10-11 MED ORDER — LISDEXAMFETAMINE DIMESYLATE 30 MG PO CAPS: 30.0000 mg | ORAL_CAPSULE | Freq: Every day | ORAL | 0 refills | Status: AC

## 2024-10-11 NOTE — Telephone Encounter (Signed)
 Controlled Rx needs to be transferred to [facility] on [address]   Patient was also told that Rx required PA
